# Patient Record
Sex: Female | Born: 1940 | Race: Black or African American | Hispanic: No | Marital: Married | Smoking: Never smoker
Health system: Southern US, Community
[De-identification: ages and names within clinical notes are randomized; demographics above are authoritative.]

## PROBLEM LIST (undated history)

## (undated) DIAGNOSIS — I10 Essential (primary) hypertension: Secondary | ICD-10-CM

## (undated) DIAGNOSIS — E785 Hyperlipidemia, unspecified: Secondary | ICD-10-CM

## (undated) DIAGNOSIS — Z972 Presence of dental prosthetic device (complete) (partial): Secondary | ICD-10-CM

## (undated) DIAGNOSIS — R569 Unspecified convulsions: Secondary | ICD-10-CM

## (undated) DIAGNOSIS — M1990 Unspecified osteoarthritis, unspecified site: Secondary | ICD-10-CM

## (undated) HISTORY — PX: CHOLECYSTECTOMY: SHX55

## (undated) HISTORY — DX: Unspecified osteoarthritis, unspecified site: M19.90

## (undated) HISTORY — DX: Essential (primary) hypertension: I10

## (undated) HISTORY — DX: Hyperlipidemia, unspecified: E78.5

## (undated) HISTORY — DX: Unspecified convulsions: R56.9

---

## 2011-12-11 ENCOUNTER — Inpatient Hospital Stay: Payer: Self-pay | Admitting: Internal Medicine

## 2011-12-11 LAB — CBC
MCH: 30.4 pg (ref 26.0–34.0)
MCHC: 33.7 g/dL (ref 32.0–36.0)
Platelet: 237 10*3/uL (ref 150–440)

## 2011-12-11 LAB — COMPREHENSIVE METABOLIC PANEL
Albumin: 3.9 g/dL (ref 3.4–5.0)
Alkaline Phosphatase: 79 U/L (ref 50–136)
Bilirubin,Total: 0.6 mg/dL (ref 0.2–1.0)
Chloride: 101 mmol/L (ref 98–107)
Co2: 22 mmol/L (ref 21–32)
EGFR (African American): >60
EGFR (Non-African Amer.): >60
Glucose: 150 mg/dL — ABNORMAL HIGH (ref 65–99)
Osmolality: 277 (ref 275–301)
Potassium: 3.5 mmol/L (ref 3.5–5.1)
Sodium: 137 mmol/L (ref 136–145)
Total Protein: 7.1 g/dL (ref 6.4–8.2)

## 2011-12-11 LAB — TROPONIN I: Troponin-I: <0.02 ng/mL

## 2011-12-12 LAB — URINALYSIS, COMPLETE
Bacteria: NONE SEEN
Bilirubin,UR: NEGATIVE
Leukocyte Esterase: NEGATIVE
Protein: NEGATIVE
Specific Gravity: 1.015 (ref 1.003–1.030)
WBC UR: 3 /HPF (ref 0–5)

## 2011-12-12 LAB — CBC WITH DIFFERENTIAL/PLATELET
Basophil %: 0.2 %
Lymphocyte #: 0.7 10*3/uL — ABNORMAL LOW (ref 1.0–3.6)
Lymphocyte %: 10.9 %
MCH: 29.5 pg (ref 26.0–34.0)
MCHC: 32.9 g/dL (ref 32.0–36.0)
Monocyte #: 0.3 x10 3/mm (ref 0.2–0.9)
Monocyte %: 4.9 %
Neutrophil %: 83.9 %
Platelet: 215 10*3/uL (ref 150–440)
RBC: 3.94 10*6/uL (ref 3.80–5.20)
RDW: 13.7 % (ref 11.5–14.5)
WBC: 6.6 10*3/uL (ref 3.6–11.0)

## 2011-12-12 LAB — COMPREHENSIVE METABOLIC PANEL
BUN: 11 mg/dL (ref 7–18)
Bilirubin,Total: 0.6 mg/dL (ref 0.2–1.0)
Calcium, Total: 8.5 mg/dL (ref 8.5–10.1)
EGFR (Non-African Amer.): >60
Glucose: 115 mg/dL — ABNORMAL HIGH (ref 65–99)
Potassium: 3.9 mmol/L (ref 3.5–5.1)
SGOT(AST): 19 U/L (ref 15–37)
SGPT (ALT): 11 U/L — ABNORMAL LOW
Sodium: 139 mmol/L (ref 136–145)

## 2011-12-12 LAB — CK TOTAL AND CKMB (NOT AT ARMC)
CK, Total: 88 U/L (ref 21–215)
CK, Total: 78 U/L (ref 21–215)

## 2011-12-12 LAB — LIPID PANEL
Cholesterol: 151 mg/dL (ref 0–200)
HDL Cholesterol: 31 mg/dL — ABNORMAL LOW (ref 40–60)
Ldl Cholesterol, Calc: 112 mg/dL — ABNORMAL HIGH (ref 0–100)

## 2011-12-12 LAB — TROPONIN I
Troponin-I: <0.02 ng/mL
Troponin-I: <0.02 ng/mL

## 2011-12-12 LAB — HEMOGLOBIN A1C: Hemoglobin A1C: 5.7 % (ref 4.2–6.3)

## 2013-05-16 DIAGNOSIS — G40109 Localization-related (focal) (partial) symptomatic epilepsy and epileptic syndromes with simple partial seizures, not intractable, without status epilepticus: Secondary | ICD-10-CM | POA: Insufficient documentation

## 2014-01-26 ENCOUNTER — Inpatient Hospital Stay: Payer: Self-pay | Admitting: Internal Medicine

## 2014-01-26 LAB — COMPREHENSIVE METABOLIC PANEL
ALBUMIN: 3.4 g/dL (ref 3.4–5.0)
ALK PHOS: 115 U/L
AST: 19 U/L (ref 15–37)
Anion Gap: 10 (ref 7–16)
BILIRUBIN TOTAL: 0.4 mg/dL (ref 0.2–1.0)
BUN: 17 mg/dL (ref 7–18)
CHLORIDE: 109 mmol/L — AB (ref 98–107)
Calcium, Total: 8.8 mg/dL (ref 8.5–10.1)
Co2: 24 mmol/L (ref 21–32)
Creatinine: 0.83 mg/dL (ref 0.60–1.30)
EGFR (African American): >60
Glucose: 125 mg/dL — ABNORMAL HIGH (ref 65–99)
Osmolality: 288 (ref 275–301)
Potassium: 3.4 mmol/L — ABNORMAL LOW (ref 3.5–5.1)
SGPT (ALT): 15 U/L (ref 12–78)
Sodium: 143 mmol/L (ref 136–145)
TOTAL PROTEIN: 6.8 g/dL (ref 6.4–8.2)

## 2014-01-26 LAB — CBC
HCT: 38.4 % (ref 35.0–47.0)
HGB: 12.6 g/dL (ref 12.0–16.0)
MCH: 29.5 pg (ref 26.0–34.0)
MCHC: 32.8 g/dL (ref 32.0–36.0)
MCV: 90 fL (ref 80–100)
PLATELETS: 213 10*3/uL (ref 150–440)
RBC: 4.27 10*6/uL (ref 3.80–5.20)
RDW: 13.8 % (ref 11.5–14.5)
WBC: 8.1 10*3/uL (ref 3.6–11.0)

## 2014-01-28 LAB — BASIC METABOLIC PANEL
Anion Gap: 3 — ABNORMAL LOW (ref 7–16)
BUN: 14 mg/dL (ref 7–18)
CALCIUM: 8.8 mg/dL (ref 8.5–10.1)
CO2: 28 mmol/L (ref 21–32)
Chloride: 109 mmol/L — ABNORMAL HIGH (ref 98–107)
Creatinine: 0.91 mg/dL (ref 0.60–1.30)
EGFR (African American): >60
EGFR (Non-African Amer.): >60
Glucose: 109 mg/dL — ABNORMAL HIGH (ref 65–99)
Osmolality: 280 (ref 275–301)
Potassium: 3.7 mmol/L (ref 3.5–5.1)
SODIUM: 140 mmol/L (ref 136–145)

## 2014-05-10 ENCOUNTER — Ambulatory Visit: Payer: Self-pay

## 2014-12-21 NOTE — H&P (Signed)
PATIENT NAME:  Renee Lopez, Renee Lopez MR#:  [id number] DATE OF BIRTH:  17-Aug-1941  DATE OF ADMISSION:  01/26/2014  PRIMARY CARE PHYSICIAN: Marland McalpineSheikh A. Ellsworth Lennoxejan-Sie, MD  CHIEF COMPLAINT: Fall today.  HISTORY OF PRESENT ILLNESS: A 74 year old [place] female with a history of hypertension, hyperlipidemia, and asthma was sent to ED due to fall today. The patient is alert, awake, oriented, in no acute distress. 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 The patient denies any loss of consciousness, seizure or syncope. No weakness. No headache or dizziness. The patient complains of right side hip pain, unable to move lower extremities. The patient got an x-ray which showed a nondisplaced right pelvic fracture.   PAST MEDICAL HISTORY: Hypertension, hyperlipidemia, asthma, seizure disorder.   PAST SURGICAL HISTORY: No.   SOCIAL HISTORY: No smoking or drinking or illicit drugs.   FAMILY HISTORY: No hypertension, diabetes, heart attack or stroke. No other family history.   ALLERGIES: No.   HOME MEDICATIONS: 1. Protonix 40 mg p.Lopez. daily.  2. Lipitor 10 mg p.Lopez. at bedtime.  3. Cozaar 50 mg p.Lopez. daily.  4. Aspirin 81 mg p.Lopez. daily.  5. Keppra 500 mg p.Lopez. b.i.d.   REVIEW OF SYSTEMS:   CONSTITUTIONAL: The patient denies any fever or chills. No headache or dizziness. No weakness.  CARDIOVASCULAR: No chest pain, palpitation, orthopnea, nocturnal dyspnea. No leg edema.  EYES: No double vision or blurry vision.  ENT: No postnasal drip, slurred speech or dysphagia.  PULMONARY: No cough, sputum, shortness of breath or hemoptysis.  GASTROINTESTINAL: No abdominal pain, nausea, vomiting, diarrhea. No melena or bloody stool.  GENITOURINARY: No dysuria, hematuria, or incontinence.  SKIN: No rash or jaundice.  NEUROLOGIC: No syncope, loss of consciousness or seizure.  MUSCULOSKELETAL: Right hip pain.   PHYSICAL EXAMINATION: VITAL SIGNS: Temperature 97.8, blood pressure 163/79, pulse 55,  oxygen saturation 100% on room air.  GENERAL: The patient is alert, awake, oriented, in no acute distress.  HEENT: Pupils round, equal and reactive to light and accommodation.  Moist oral mucosa. Clear oropharynx. NECK: Supple. No JVD or carotid bruit. No lymphadenopathy or thyromegaly.  CARDIOVASCULAR: S1, S2 regular rate and rhythm. No murmurs or gallops.  PULMONARY: Bilateral air entry. No wheezing or rales. No use of accessory muscles to breathe.  ABDOMEN: Soft. No distention or tenderness. No organomegaly. Bowel sounds present.  EXTREMITIES: No edema, clubbing or cyanosis. No calf tenderness. Bilateral pedal pulses present.  NEUROLOGIC: AO x3. No focal deficits. The patient cannot move her lower extremities due to pain.  SKIN: No rash or jaundice.   LABORATORY DATA:  1. Chest x-ray: No active disease.  2. Right hip x-ray showed no acute osseous injury of right hip.  3. A CAT scan of her hip showed nondisplaced comminuted right inferior pubic ramus fracture. No right hip fracture or dislocation.  4. Communicated and mildly displaced right iliac wing fracture.   Glucose 125, BUN 17, creatinine 0.83, sodium 143, potassium 3.4, chloride 109, bicarbonate 24. CBC in normal range.   IMPRESSIONS: 1. Pelvic fracture.  2. Hypertension.  3. Hyperlipidemia.  4. History of seizure disorder.  5. Hypokalemia.   PLAN OF TREATMENT: 1. The patient will be admitted to medical floor. We will start pain control, get a PT consult. According to the ED physician, Dr. Ernest PineHooten, suggested no indication for surgery. We will get her PT evaluation, possible discharge plan to rehab or home with home health.  2. For hypertension, hyperlipidemia, will continue aspirin, Cozaar,  Lipitor.  3. Deep vein thrombosis prophylaxis.  4. For hypokalemia, give potassium supplement.  5. I discussed the patient's condition and plan of treatment with the patient and the patient's sons.  CODE STATUS: The patient wants FULL  CODE.   TIME SPENT: About 32 minutes.    ____________________________ Shaune Pollack, MD qc:lm D: 01/26/2014 20:51:52 ET T: 01/26/2014 21:38:06 ET JOB#: [id number]  cc: Shaune Pollack, MD, <Dictator> Shaune Pollack MD ELECTRONICALLY SIGNED 01/30/2014 12:47

## 2014-12-22 NOTE — H&P (Signed)
PATIENT NAME:  Renee Lopez, Renee Lopez MR#:  [id number] DATE OF BIRTH:  [birth year]  DATE OF ADMISSION:  12/11/2011  REFERRING PHYSICIAN: Dr. Carollee MassedKaminski FAMILY PHYSICIAN: None  REASON FOR ADMISSION: Transient left right-sided weakness worrisome for transient ischemic attack.   HISTORY OF PRESENT ILLNESS: The patient is a 74 year old female with no local physician with history of asthma who presents to the Emergency Room with transient left-sided weakness which has subsequently resolved. The patient states that she has had shortness of breath, weakness, nausea, and vomiting. Developed acute onset left-sided weakness which lasted approximately 30 to 45 minutes. Symptoms resolved and her exam is currently unremarkable. She is now admitted for further evaluation.   PAST MEDICAL HISTORY:  1. History of asthma.  2. Status post cholecystectomy.  3. Gastroesophageal reflux disease.   MEDICATIONS: None.   ALLERGIES: No known drug allergies.   SOCIAL HISTORY: Negative for alcohol or tobacco abuse.   FAMILY HISTORY: Positive for hypertension and diabetes.   REVIEW OF SYSTEMS: CONSTITUTIONAL: No fever or change in weight. EYES: No blurred or double vision. No glaucoma. ENT: No tinnitus or hearing loss. No nasal discharge or bleeding. No difficulty swallowing. RESPIRATORY: No cough or wheezing. No hemoptysis. No painful respiration. CARDIOVASCULAR: No chest pain or orthopnea. Denies palpitations or syncope. GASTROINTESTINAL: Some nausea and vomiting but no diarrhea. No abdominal pain. No change in bowel habits. GENITOURINARY: No dysuria or hematuria. No incontinence. ENDOCRINE: No polyuria or polydipsia. No heat or cold intolerance. HEMATOLOGIC: Patient denies anemia, easy bruising, or bleeding. LYMPHATIC: No swollen glands. MUSCULOSKELETAL: Patient denies pain in her neck, back, shoulders, knees, or hips. No gout. NEUROLOGIC: No numbness although she has had generalized weakness. Denies migraines, stroke or  seizures. PSYCH: Patient denies anxiety, insomnia, or depression.   PHYSICAL EXAMINATION:  GENERAL: The patient is in no acute distress.   VITAL SIGNS: Vital signs remarkable for a blood pressure of 163/81 with a heart rate of 59 and a respiratory rate of 18. She is afebrile.   HEENT: Normocephalic, atraumatic. Pupils equally round and reactive to light and accommodation. Extraocular movements are intact. Sclerae are not icteric. Conjunctivae are clear. Oropharynx is clear.   NECK: Supple without jugular venous distention or bruits. No adenopathy or thyromegaly is noted.   LUNGS: Clear to auscultation and percussion without wheezes, rales, or rhonchi. No dullness.   CARDIAC: Regular rate and rhythm with normal S1, S2. No significant rubs, murmurs, or gallops. PMI is nondisplaced. Chest wall is nontender.   ABDOMEN: Soft, nontender with normoactive bowel sounds. No organomegaly or masses were appreciated. No hernias or bruits were noted.   EXTREMITIES: Without clubbing, cyanosis, edema. Pulses were 2+ bilaterally.   SKIN: Warm and dry without rash or lesions.   NEUROLOGIC: Cranial nerves II through XII grossly intact. Deep tendon reflexes were symmetric. Motor and sensory exam is nonfocal.   PSYCH: Exam revealed a patient who is alert and oriented to person, place, and time. She was cooperative and used good judgment.   LABORATORY, DIAGNOSTIC AND RADIOLOGICAL DATA: White count was 8.6 with a hemoglobin of 12.9. Glucose was 150 with a BUN of 13 and a creatinine of 0.87 with a sodium of 137 and a potassium of 3.5. GFR was greater than 60. Troponin was less than 0.02. EKG revealed sinus bradycardia with no acute ischemic changes. Head CT revealed no acute abnormality.   ASSESSMENT:  1. Transient left-sided weakness worrisome for transient ischemic attack.  2. Shortness of breath.  3. Nausea, vomiting.  4.  Bradycardia.  5. Benign hypertension.  6. Hyperglycemia.   PLAN: Patient will be  admitted to telemetry with Lovenox and Plavix. Will begin empiric statin therapy as well as Cozaar for blood pressure control. Will follow her sugars with Accu-Cheks before meals and at bedtime and add sliding scale insulin as needed. Will check an A1c and a lipid profile in the morning. Will perform neuro checks q.4 hours at this time with vital signs q.4 hours. Will obtain carotid Doppler's, echocardiogram, and an MRI of the brain because of her transient ischemic attack-like symptoms. Further treatment and evaluation will depend upon the patient's progress.   TOTAL TIME SPENT ON THIS PATIENT: 50 minutes.   ____________________________ Duane Lope Judithann Sheen, MD jds:cms D: 12/11/2011 22:44:50 ET T: 12/12/2011 08:23:35 ET JOB#: [id number]  cc: Duane Lope. Judithann Sheen, MD, <Dictator> Altie Savard Rodena Medin MD ELECTRONICALLY SIGNED 12/14/2011 15:05

## 2014-12-22 NOTE — Discharge Summary (Signed)
PATIENT NAME:  Renee Lopez, Renee Lopez MR#:  [id number] DATE OF BIRTH:  [birth year]  DATE OF ADMISSION:  12/11/2011 DATE OF DISCHARGE:  12/13/2011  REASON FOR ADMISSION: Transient left side weakness.  DISCHARGE DIAGNOSES: 1. Possible transient ischemic attack.   2. Hypertension.  3. Hyperlipidemia.  HOME MEDICATIONS: 1. Aspirin 81 mg p.Lopez. daily.  2. Cozaar 50 mg p.Lopez. daily.  3. Protonix 40 mg p.Lopez. daily.  4. Lipitor 10 mg p.Lopez. daily.  DIET: Low sodium diet.  ACTIVITY: As tolerated.  DISCHARGE FOLLOWUP: Followup with your  PCP within 1 to 2 weeks.   HOSPITAL COURSE: The patient is a 74 year old African American female with past history of asthma and GERD who presented to the emergency department with transient left side weakness which subsequently resolved. The patient also stated that she had shortness of breath, weakness, nausea and vomiting. She said the left side weakness lasted about 30 to 45 minutes. The patient was admitted for transient left side weakness worrisome for TIA. For a detailed history and physical examination, please refer to the admission note dictated by Dr. Judithann SheenSparks. After admission the patient has been treated with Plavix and Lipitor for possible transient ischemic attack. In addition, since the patient has an elevated blood pressure, she has been treated with Cozaar. Her of the CT head revealed no acute abnormality. MRI of the brain showed no intracranial abnormalities. Carotid duplex showed no stenosis. Echocardiogram showed normal ejection fraction of more than 55%. The patient had elevated blood sugar of about 115 to 130, but Hem A1c was 5.7. In addition troponin level was less than 0.02. The patient's lipid panel showed a LDL of 112 and HDL 31. She has been treated with Lipitor. Since the patient is clinically stable, she will be discharged to home today. I discussed the patient's situation and the discharge plan with the patient, the patient's two daughters, and the  nurse.   TIME SPENT: Approximately 45 minutes. ___________________________ Renee PollackQing Yi Falletta, MD qc:slb D: 12/13/2011 17:41:54 ET T: 12/14/2011 16:07:47 ET JOB#: [id number]  cc: Renee PollackQing Jaeley Wiker, MD, <Dictator> Renee PollackQING Natallie Ravenscroft MD ELECTRONICALLY SIGNED 12/14/2011 17:33

## 2015-05-06 ENCOUNTER — Ambulatory Visit (INDEPENDENT_AMBULATORY_CARE_PROVIDER_SITE_OTHER): Payer: Medicare PPO | Admitting: Unknown Physician Specialty

## 2015-05-06 ENCOUNTER — Encounter: Payer: Self-pay | Admitting: Unknown Physician Specialty

## 2015-05-06 VITALS — BP 160/89 | HR 61 | Temp 98.2°F | Ht 62.8 in | Wt 139.8 lb

## 2015-05-06 DIAGNOSIS — Z0000 Encounter for general adult medical examination without abnormal findings: Secondary | ICD-10-CM

## 2015-05-06 DIAGNOSIS — I10 Essential (primary) hypertension: Secondary | ICD-10-CM

## 2015-05-06 DIAGNOSIS — E785 Hyperlipidemia, unspecified: Secondary | ICD-10-CM

## 2015-05-06 DIAGNOSIS — M1990 Unspecified osteoarthritis, unspecified site: Secondary | ICD-10-CM | POA: Insufficient documentation

## 2015-05-06 DIAGNOSIS — M1712 Unilateral primary osteoarthritis, left knee: Secondary | ICD-10-CM

## 2015-05-06 LAB — LIPID PANEL PICCOLO, WAIVED
Chol/HDL Ratio Piccolo,Waive: 4.1 mg/dL
Cholesterol Piccolo, Waived: 123 mg/dL (ref <200)
HDL Chol Piccolo, Waived: 30 mg/dL — ABNORMAL LOW (ref >59)
LDL Chol Calc Piccolo Waived: 76 mg/dL (ref <100)
TRIGLYCERIDES PICCOLO,WAIVED: 85 mg/dL (ref <150)
VLDL CHOL CALC PICCOLO,WAIVE: 17 mg/dL (ref <30)

## 2015-05-06 MED ORDER — LOSARTAN POTASSIUM 100 MG PO TABS: 100.0000 mg | ORAL_TABLET | Freq: Every day | ORAL | Status: DC

## 2015-05-06 MED ORDER — ATORVASTATIN CALCIUM 10 MG PO TABS: 10.0000 mg | ORAL_TABLET | Freq: Every day | ORAL | Status: DC

## 2015-05-06 NOTE — Progress Notes (Signed)
Renee Lopez is a 74 y.o. female who presents for Medicare Annual/Subsequent preventive examination.  Preventive Screening-Counseling & Management  Tobacco History  Smoking status  . Never Smoker   Smokeless tobacco  . Never Used     Problems Prior to Visit 1. Hypertension  High today but white coat syndrome.  Below 120/70 at home.   2.  Hypercholesterol Doing well on Atorvastatin  Current Problems (verified) Patient Active Problem List   Diagnosis Date Noted  . Hypertension 05/06/2015  . Hyperlipidemia 05/06/2015  . Osteoarthritis 05/06/2015    Medications Prior to Visit No current outpatient prescriptions on file prior to visit.   No current facility-administered medications on file prior to visit.    Current Medications (verified) Current Outpatient Prescriptions  Medication Sig Dispense Refill  . aspirin 81 MG tablet Take 81 mg by mouth daily.    Marland Kitchen atorvastatin (LIPITOR) 10 MG tablet Take 10 mg by mouth daily.    Marland Kitchen levETIRAcetam (KEPPRA) 500 MG tablet Take 500 mg by mouth 2 (two) times daily.    Marland Kitchen losartan (COZAAR) 100 MG tablet Take 100 mg by mouth daily.     No current facility-administered medications for this visit.     Allergies (verified) Review of patient's allergies indicates no known allergies.   PAST HISTORY  Family History Family History  Problem Relation Age of Onset  . Seizures Mother   . Hyperlipidemia Mother   . Hypertension Mother   . Cancer Father     colon  . Hypertension Father     Social History Social History  Substance Use Topics  . Smoking status: Never Smoker   . Smokeless tobacco: Never Used  . Alcohol Use: No      Are there smokers in your home (other than you)? No  Risk Factors Current exercise habits: Home exercise routine includes walking 2-3 hrs per day.  Dietary issues discussed: healthy   Cardiac risk factors: advanced age (older than 64 for men, 48 for women).  Depression Screen (Note: if  answer to either of the following is "Yes", a more complete depression screening is indicated)   Over the past two weeks, have you felt down, depressed or hopeless? No  Over the past two weeks, have you felt little interest or pleasure in doing things? No  Have you lost interest or pleasure in daily life? No  Do you often feel hopeless? No  Do you cry easily over simple problems? No  Activities of Daily Living In your present state of health, do you have any difficulty performing the following activities?:  Driving? Yes Managing money?  No Feeding yourself? No Getting from bed to chair? NoBP 160/89 mmHg  Pulse 61  Temp(Src) 98.2 F (36.8 C)  Ht 5' 2.8" (1.595 m)  Wt 139 lb 12.8 oz (63.413 kg)  BMI 24.93 kg/m2  SpO2 96%  General Appearance:    Alert, cooperative, no distress, appears stated age  Head:    Normocephalic, without obvious abnormality, atraumatic  Eyes:    PERRL, conjunctiva/corneas clear, EOM's intact, fundi    benign, both eyes  Ears:    Normal TM's and external ear canals, both ears  Nose:   Nares normal, septum midline, mucosa normal, no drainage    or sinus tenderness  Throat:   Lips, mucosa, and tongue normal; teeth and gums normal  Neck:   Supple, symmetrical, trachea midline, no adenopathy;    thyroid:  no enlargement/tenderness/nodules; no carotid   bruit or JVD  Back:     Symmetric, no curvature, ROM normal, no CVA tenderness  Lungs:     Clear to auscultation bilaterally, respirations unlabored  Chest Wall:    No tenderness or deformity   Heart:    Regular rate and rhythm, S1 and S2 normal, no murmur, rub   or gallop     Abdomen:     Soft, non-tender, bowel sounds active all four quadrants,    no masses, no organomegaly  Genitalia:    Normal female without lesion, discharge or tenderness  Rectal:    Normal tone, normal prostate, no masses or tenderness;   guaiac negative stool  Extremities:   Extremities normal, atraumatic, no cyanosis or edema  Pulses:    2+ and symmetric all extremities  Skin:   Skin color, texture, turgor normal, no rashes or lesions  Lymph nodes:   Cervical, supraclavicular, and axillary nodes normal  Neurologic:   CNII-XII intact, normal strength, sensation and reflexes    throughout   Climbing a flight of stairs? No Preparing food and eating?: No Bathing or showering? No Getting dressed: No Getting to the toilet? No Using the toilet:No Moving around from place to place: No In the past year have you fallen or had a near fall?:No   Are you sexually active?  No  Do you have more than one partner?  No  Hearing Difficulties: No Do you often ask people to speak up or repeat themselves? No Do you experience ringing or noises in your ears? No Do you have difficulty understanding soft or whispered voices? No   Do you feel that you have a problem with memory? no  Do you often misplace items? No  Do you feel safe at home?  No  Cognitive Testing  Alert? yes Normal Appearance?yes  Oriented to person? Yes  Place? Yes   Time? Yes  Recall of three objects?  Yes  Can perform simple calculations? Yes  Displays appropriate judgment?Yes  Can read the correct time from a watch face?Yes   Advanced Directives have been discussed with the patient? Yes   Immunization History  Administered Date(s) Administered  . Pneumococcal Conjugate-13 05/10/2014  . Td 01/07/2014    Screening Tests Health Maintenance  Topic Date Due  . COLONOSCOPY  04/06/1989  . ZOSTAVAX  04/07/1999  . DEXA SCAN  04/06/2004  . INFLUENZA VACCINE  03/31/2015  . PNA vac Low Risk Adult (2 of 2 - PPSV23) 05/11/2015  . TETANUS/TDAP  01/08/2024   Refusing all outstanding health maintenance.    Hypertension: Stable.  Continue present meds.   Hyperlipidemia:  Stable.  Continue present meds.    Check CMP, Uric Acid, Lipid Panel, Microalbumin  Subjective:    Renee Lopez is a 74 y.o. female who presents for Medicare Annual/Subsequent  preventive examination.  Preventive Screening-Counseling & Management  Tobacco History  Smoking status  . Never Smoker   Smokeless tobacco  . Never Used     Problems Prior to Visit 1.   Current Problems (verified) Patient Active Problem List   Diagnosis Date Noted  . Hypertension 05/06/2015  . Hyperlipidemia 05/06/2015  . Osteoarthritis 05/06/2015    Medications Prior to Visit No current outpatient prescriptions on file prior to visit.   No current facility-administered medications on file prior to visit.    Current Medications (verified) Current Outpatient Prescriptions  Medication Sig Dispense Refill  . aspirin 81 MG tablet Take 81 mg by mouth daily.    Marland Kitchen atorvastatin (LIPITOR) 10  MG tablet Take 10 mg by mouth daily.    Marland Kitchen levETIRAcetam (KEPPRA) 500 MG tablet Take 500 mg by mouth 2 (two) times daily.    Marland Kitchen losartan (COZAAR) 100 MG tablet Take 100 mg by mouth daily.     No current facility-administered medications for this visit.     Allergies (verified) Review of patient's allergies indicates no known allergies.   PAST HISTORY  Family History Family History  Problem Relation Age of Onset  . Seizures Mother   . Hyperlipidemia Mother   . Hypertension Mother   . Cancer Father     colon  . Hypertension Father     Social History Social History  Substance Use Topics  . Smoking status: Never Smoker   . Smokeless tobacco: Never Used  . Alcohol Use: No      Are there smokers in your home (other than you)? No  Risk Factors Current exercise habits: Home exercise routine includes walking 2-3 hrs per day.  Dietary issues discussed: healthy   Cardiac risk factors: advanced age (older than 25 for men, 18 for women).  Depression Screen (Note: if answer to either of the following is "Yes", a more complete depression screening is indicated)   Over the past two weeks, have you felt down, depressed or hopeless? No  Over the past two weeks, have you felt little  interest or pleasure in doing things? No  Have you lost interest or pleasure in daily life? No  Do you often feel hopeless? No  Do you cry easily over simple problems? No  Activities of Daily Living In your present state of health, do you have any difficulty performing the following activities?:  Driving? Yes Managing money?  No Feeding yourself? No Getting from bed to chair? NoBP 160/89 mmHg  Pulse 61  Temp(Src) 98.2 F (36.8 C)  Ht 5' 2.8" (1.595 m)  Wt 139 lb 12.8 oz (63.413 kg)  BMI 24.93 kg/m2  SpO2 96%  General Appearance:    Alert, cooperative, no distress, appears stated age  Head:    Normocephalic, without obvious abnormality, atraumatic  Eyes:    PERRL, conjunctiva/corneas clear, EOM's intact, fundi    benign, both eyes  Ears:    Normal TM's and external ear canals, both ears  Nose:   Nares normal, septum midline, mucosa normal, no drainage    or sinus tenderness  Throat:   Lips, mucosa, and tongue normal; teeth and gums normal  Neck:   Supple, symmetrical, trachea midline, no adenopathy;    thyroid:  no enlargement/tenderness/nodules; no carotid   bruit or JVD  Back:     Symmetric, no curvature, ROM normal, no CVA tenderness  Lungs:     Clear to auscultation bilaterally, respirations unlabored  Chest Wall:    No tenderness or deformity   Heart:    Regular rate and rhythm, S1 and S2 normal, no murmur, rub   or gallop     Abdomen:     Soft, non-tender, bowel sounds active all four quadrants,    no masses, no organomegaly  Genitalia:    Normal female without lesion, discharge or tenderness  Rectal:    Normal tone, normal prostate, no masses or tenderness;   guaiac negative stool  Extremities:   Extremities normal, atraumatic, no cyanosis or edema  Pulses:   2+ and symmetric all extremities  Skin:   Skin color, texture, turgor normal, no rashes or lesions  Lymph nodes:   Cervical, supraclavicular, and axillary nodes normal  Neurologic:   CNII-XII intact, normal  strength, sensation and reflexes    throughout   Climbing a flight of stairs? No Preparing food and eating?: No Bathing or showering? No Getting dressed: No Getting to the toilet? No Using the toilet:No Moving around from place to place: No In the past year have you fallen or had a near fall?:No   Are you sexually active?  No  Do you have more than one partner?  No  Hearing Difficulties: No Do you often ask people to speak up or repeat themselves? No Do you experience ringing or noises in your ears? No Do you have difficulty understanding soft or whispered voices? No   Do you feel that you have a problem with memory? no  Do you often misplace items? No  Do you feel safe at home?  No  Cognitive Testing  Alert? yes Normal Appearance?yes  Oriented to person? Yes  Place? Yes   Time? Yes  Recall of three objects?  Yes  Can perform simple calculations? Yes  Displays appropriate judgment?Yes  Can read the correct time from a watch face?Yes   Advanced Directives have been discussed with the patient? Yes  List the Names of Other Physician/Practitioners you currently use: 1.    Indicate any recent Medical Services you may have received from other than [facility] providers in the past year (date may be approximate).  Immunization History  Administered Date(s) Administered  . Pneumococcal Conjugate-13 05/10/2014  . Td 01/07/2014    Screening Tests Health Maintenance  Topic Date Due  . COLONOSCOPY  04/06/1989  . ZOSTAVAX  04/07/1999  . DEXA SCAN  04/06/2004  . INFLUENZA VACCINE  03/31/2015  . PNA vac Low Risk Adult (2 of 2 - PPSV23) 05/11/2015  . TETANUS/TDAP  01/08/2024    All answers were reviewed with the patient and necessary referrals were made:  Gabriel Cirri, NP   05/06/2015   History reviewed: allergies, current medications, past family history, past medical history, past social history, past surgical history and problem list  Review of Systems A comprehensive  review of systems was negative.    Objective:     Vision by Snellen chart: right eye:20/20, left eye:20/20  Body mass index is 24.93 kg/(m^2). BP 160/89 mmHg  Pulse 61  Temp(Src) 98.2 F (36.8 C)  Ht 5' 2.8" (1.595 m)  Wt 139 lb 12.8 oz (63.413 kg)  BMI 24.93 kg/m2  SpO2 96%  BP 160/89 mmHg  Pulse 61  Temp(Src) 98.2 F (36.8 C)  Ht 5' 2.8" (1.595 m)  Wt 139 lb 12.8 oz (63.413 kg)  BMI 24.93 kg/m2  SpO2 96%  General Appearance:    Alert, cooperative, no distress, appears stated age  Head:    Normocephalic, without obvious abnormality, atraumatic  Eyes:    PERRL, conjunctiva/corneas clear, EOM's intact, fundi    benign, both eyes  Ears:    Normal TM's and external ear canals, both ears  Nose:   Nares normal, septum midline, mucosa normal, no drainage    or sinus tenderness  Throat:   Lips, mucosa, and tongue normal; teeth and gums normal  Neck:   Supple, symmetrical, trachea midline, no adenopathy;    thyroid:  no enlargement/tenderness/nodules; no carotid   bruit or JVD  Back:     Symmetric, no curvature, ROM normal, no CVA tenderness  Lungs:     Clear to auscultation bilaterally, respirations unlabored  Chest Wall:    No tenderness or deformity   Heart:  Regular rate and rhythm, S1 and S2 normal, no murmur, rub   or gallop  Breast Exam:    No tenderness, masses, or nipple abnormality  Abdomen:     Soft, non-tender, bowel sounds active all four quadrants,    no masses, no organomegaly  Genitalia:    Normal female without lesion, discharge or tenderness  Rectal:    Normal tone, normal prostate, no masses or tenderness;   guaiac negative stool  Extremities:   Extremities normal, atraumatic, no cyanosis or edema  Pulses:   2+ and symmetric all extremities  Skin:   Skin color, texture, turgor normal, no rashes or lesions  Lymph nodes:   Cervical, supraclavicular, and axillary nodes normal  Neurologic:   CNII-XII intact, normal strength, sensation and reflexes     throughout       Assessment:   Healthy        Plan:     During the course of the visit the patient was educated and counseled about appropriate screening and preventive services including:  She is refusing colonoscopy, Zostavax,  Diet review for nutrition referral? Not Indicated   Patient Instructions (the written plan) was given to the patient.  Medicare Attestation I have personally reviewed: The patient's medical and social history Their use of alcohol, tobacco or illicit drugs Their current medications and supplements The patient's functional ability including ADLs,fall risks, home safety risks, cognitive, and hearing and visual impairment Diet and physical activities Evidence for depression or mood disorders  The patient's weight, height, BMI, and visual acuity have been recorded in the chart.  I have made referrals, counseling, and provided education to the patient based on review of the above and I have provided the patient with a written personalized care plan for preventive services.     Gabriel Cirri, NP   05/06/2015

## 2015-05-07 LAB — COMPREHENSIVE METABOLIC PANEL
A/G RATIO: 1.8 (ref 1.1–2.5)
ALBUMIN: 4.2 g/dL (ref 3.5–4.8)
ALK PHOS: 111 IU/L (ref 39–117)
ALT: 7 IU/L (ref 0–32)
AST: 19 IU/L (ref 0–40)
BUN/Creatinine Ratio: 17 (ref 11–26)
BUN: 13 mg/dL (ref 8–27)
Bilirubin Total: 0.8 mg/dL (ref 0.0–1.2)
CALCIUM: 9.6 mg/dL (ref 8.7–10.3)
CO2: 22 mmol/L (ref 18–29)
Chloride: 100 mmol/L (ref 97–108)
Creatinine, Ser: 0.76 mg/dL (ref 0.57–1.00)
GFR calc Af Amer: 88 mL/min/{1.73_m2} (ref >59)
GFR, EST NON AFRICAN AMERICAN: 76 mL/min/{1.73_m2} (ref >59)
GLOBULIN, TOTAL: 2.4 g/dL (ref 1.5–4.5)
Glucose: 93 mg/dL (ref 65–99)
POTASSIUM: 4.4 mmol/L (ref 3.5–5.2)
SODIUM: 140 mmol/L (ref 134–144)
Total Protein: 6.6 g/dL (ref 6.0–8.5)

## 2015-05-07 LAB — URIC ACID: Uric Acid: 4.8 mg/dL (ref 2.5–7.1)

## 2015-11-03 ENCOUNTER — Encounter: Payer: Self-pay | Admitting: Unknown Physician Specialty

## 2015-11-03 ENCOUNTER — Ambulatory Visit (INDEPENDENT_AMBULATORY_CARE_PROVIDER_SITE_OTHER): Payer: Medicare PPO | Admitting: Unknown Physician Specialty

## 2015-11-03 VITALS — BP 159/93 | HR 58 | Temp 97.9°F | Ht 61.5 in | Wt 140.0 lb

## 2015-11-03 DIAGNOSIS — E785 Hyperlipidemia, unspecified: Secondary | ICD-10-CM | POA: Diagnosis not present

## 2015-11-03 DIAGNOSIS — M1712 Unilateral primary osteoarthritis, left knee: Secondary | ICD-10-CM

## 2015-11-03 DIAGNOSIS — I10 Essential (primary) hypertension: Secondary | ICD-10-CM

## 2015-11-03 NOTE — Assessment & Plan Note (Signed)
Stable, continue present medications.   

## 2015-11-03 NOTE — Assessment & Plan Note (Signed)
Last steroid injection did well for over a year.  Will continue unless no relief

## 2015-11-03 NOTE — Progress Notes (Signed)
BP 159/93 mmHg  Pulse 58  Temp(Src) 97.9 F (36.6 C)  Ht 5' 1.5" (1.562 m)  Wt 140 lb (63.504 kg)  BMI 26.03 kg/m2  SpO2 97%  LMP  (LMP Unknown)   Subjective:    Patient ID: Renee CrandallMildred O Lopez, female    DOB: 03/08/1941, 75 y.o.   MRN: [id number]  HPI: Renee CrandallMildred O Lopez is a 75 y.o. female  Chief Complaint  Patient presents with  . Hyperlipidemia  . Hypertension   Hypertension States her BP is high as she is nervous when she comes in Using medications without difficulty Average home BPs with 120s-130s/80s  No problems or lightheadedness No chest pain with exertion or shortness of breath No Edema   Hyperlipidemia Using medications without problems: No Muscle aches  Diet compliance: eats well Exercise: walks a lot when it is not cold  Left knee pain Last knee injection was October 2016.  She states it is bothering her more and would like another injection   Relevant past medical, surgical, family and social history reviewed and updated as indicated. Interim medical history since our last visit reviewed. Allergies and medications reviewed and updated.  Review of Systems  Per HPI unless specifically indicated above     Objective:    BP 159/93 mmHg  Pulse 58  Temp(Src) 97.9 F (36.6 C)  Ht 5' 1.5" (1.562 m)  Wt 140 lb (63.504 kg)  BMI 26.03 kg/m2  SpO2 97%  LMP  (LMP Unknown)  Wt Readings from Last 3 Encounters:  11/03/15 140 lb (63.504 kg)  05/06/15 139 lb 12.8 oz (63.413 kg)  11/08/14 137 lb (62.143 kg)    Physical Exam  Constitutional: She is oriented to person, place, and time. She appears well-developed and well-nourished. No distress.  HENT:  Head: Normocephalic and atraumatic.  Eyes: Conjunctivae and lids are normal. Right eye exhibits no discharge. Left eye exhibits no discharge. No scleral icterus.  Neck: Normal range of motion. Neck supple. No JVD present. Carotid bruit is not present.  Cardiovascular: Normal rate, regular rhythm and  normal heart sounds.   Pulmonary/Chest: Effort normal and breath sounds normal.  Abdominal: Normal appearance. There is no splenomegaly or hepatomegaly.  Musculoskeletal:       Right knee: She exhibits decreased range of motion and swelling.  Neurological: She is alert and oriented to person, place, and time.  Skin: Skin is warm, dry and intact. No rash noted. No pallor.  Psychiatric: She has a normal mood and affect. Her behavior is normal. Judgment and thought content normal.   STEROID INJECTION  Procedure: Knee Intraarticular Steroid Injection   Description: After verbal consent and patient ed on Area prepped and draped using  semi-sterile technique. Using a anterior  approach, a mixture of 4 cc of  1% Marcaine & 1 cc of Kenalog 40 was injected into knee joint.  A bandage was then placed over the injection site. Complications:  none Post Procedure Instructions: To the ER if any symptoms of erythema or swelling.   Follow Up: prn   Results for orders placed or performed in visit on 05/06/15  Lipid Panel Piccolo, Arrow ElectronicsWaived  Result Value Ref Range   Cholesterol Piccolo, Waived 123 <200 mg/dL   HDL Chol Piccolo, Waived 30 (L) >59 mg/dL   Triglycerides Piccolo,Waived 85 <150 mg/dL   Chol/HDL Ratio Piccolo,Waive 4.1 mg/dL   LDL Chol Calc Piccolo Waived 76 <100 mg/dL   VLDL Chol Calc Piccolo,Waive 17 <30 mg/dL  Uric acid  Result  Value Ref Range   Uric Acid 4.8 2.5 - 7.1 mg/dL  Comprehensive metabolic panel  Result Value Ref Range   Glucose 93 65 - 99 mg/dL   BUN 13 8 - 27 mg/dL   Creatinine, Ser 0.86 0.57 - 1.00 mg/dL   GFR calc non Af Amer 76 >59 mL/min/1.73   GFR calc Af Amer 88 >59 mL/min/1.73   BUN/Creatinine Ratio 17 11 - 26   Sodium 140 134 - 144 mmol/L   Potassium 4.4 3.5 - 5.2 mmol/L   Chloride 100 97 - 108 mmol/L   CO2 22 18 - 29 mmol/L   Calcium 9.6 8.7 - 10.3 mg/dL   Total Protein 6.6 6.0 - 8.5 g/dL   Albumin 4.2 3.5 - 4.8 g/dL   Globulin, Total 2.4 1.5 - 4.5 g/dL    Albumin/Globulin Ratio 1.8 1.1 - 2.5   Bilirubin Total 0.8 0.0 - 1.2 mg/dL   Alkaline Phosphatase 111 39 - 117 IU/L   AST 19 0 - 40 IU/L   ALT 7 0 - 32 IU/L      Assessment & Plan:   Problem List Items Addressed This Visit      Unprioritized   Hypertension - Primary    Stable, continue present medications.        Hyperlipidemia    Stable, continue present medications.       Osteoarthritis    Last steroid injection did well for over a year.  Will continue unless no relief          Follow up plan: Return for 6 months for physical.

## 2016-02-11 ENCOUNTER — Other Ambulatory Visit: Payer: Self-pay | Admitting: Unknown Physician Specialty

## 2016-03-03 ENCOUNTER — Other Ambulatory Visit: Payer: Self-pay | Admitting: Unknown Physician Specialty

## 2016-03-26 IMAGING — CR DG KNEE COMPLETE 4+V*L*
4 series · 4 of 4 positions shown · non-contrast
Comparison: None.

CLINICAL DATA: Pain.

EXAM:
LEFT KNEE - COMPLETE 4+ VIEW

[knee ap]
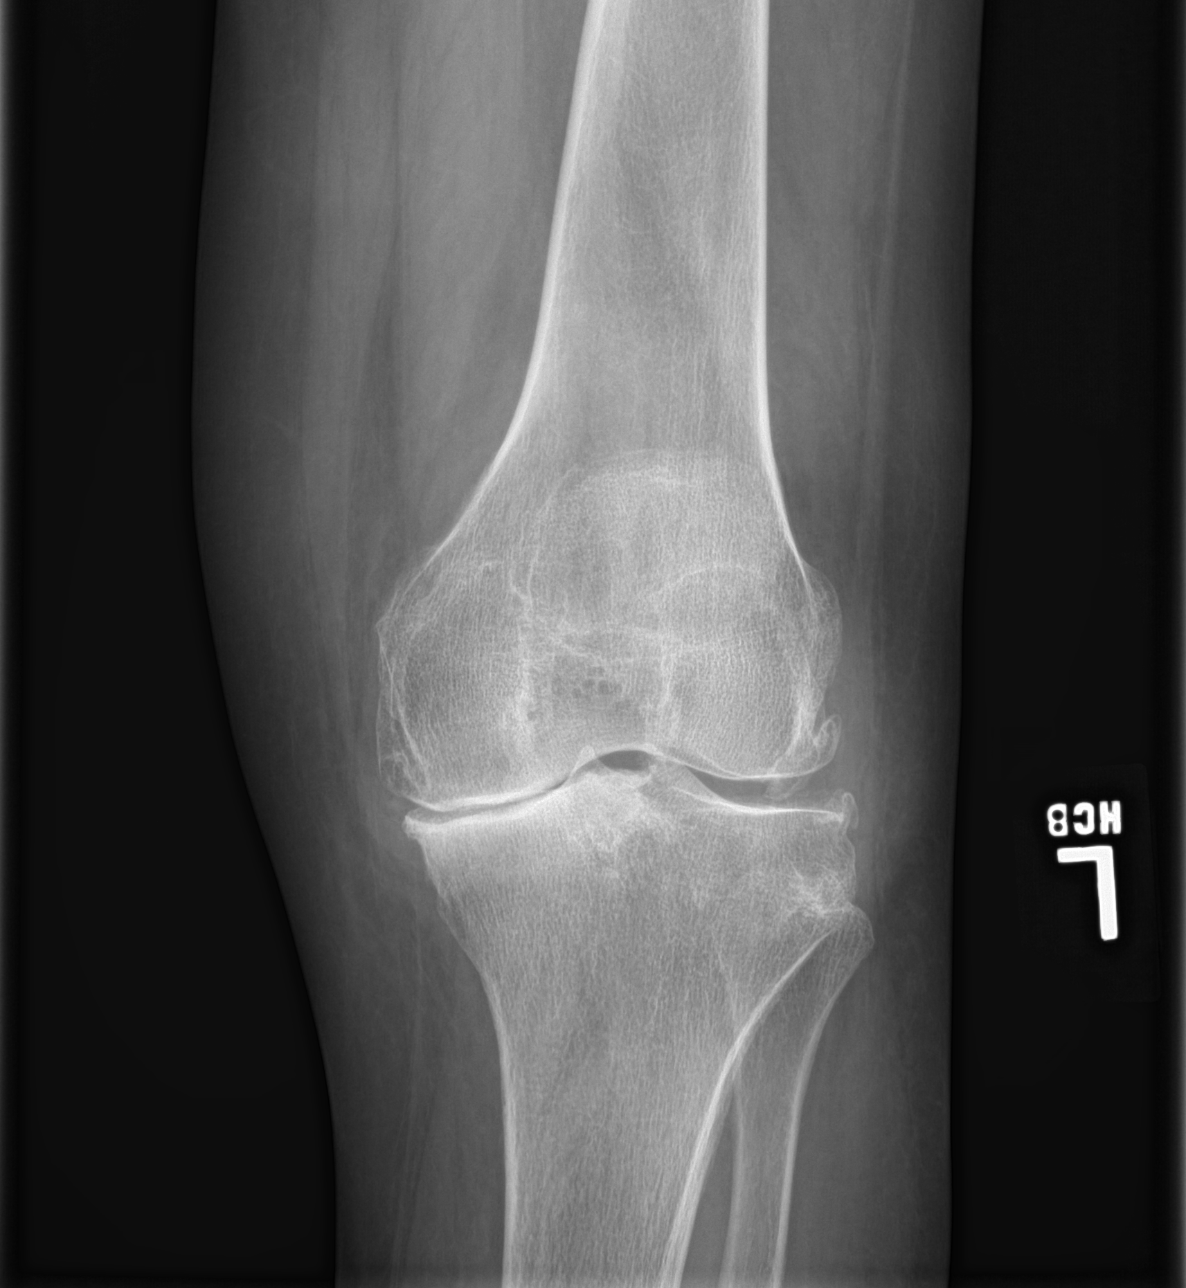

[knee obl (1 of 2)]
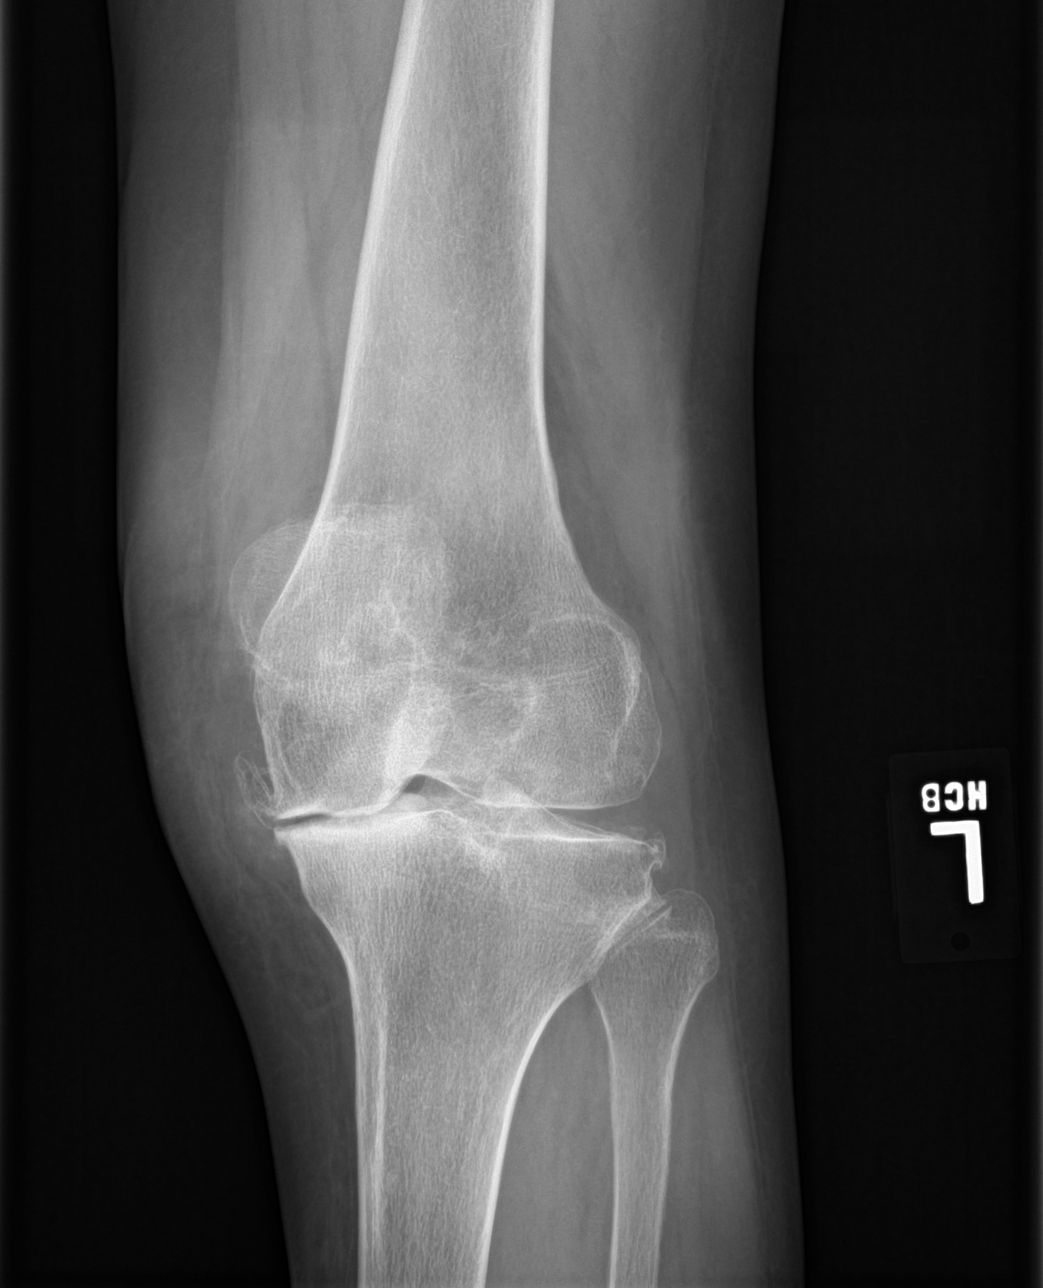

[knee obl (2 of 2)]
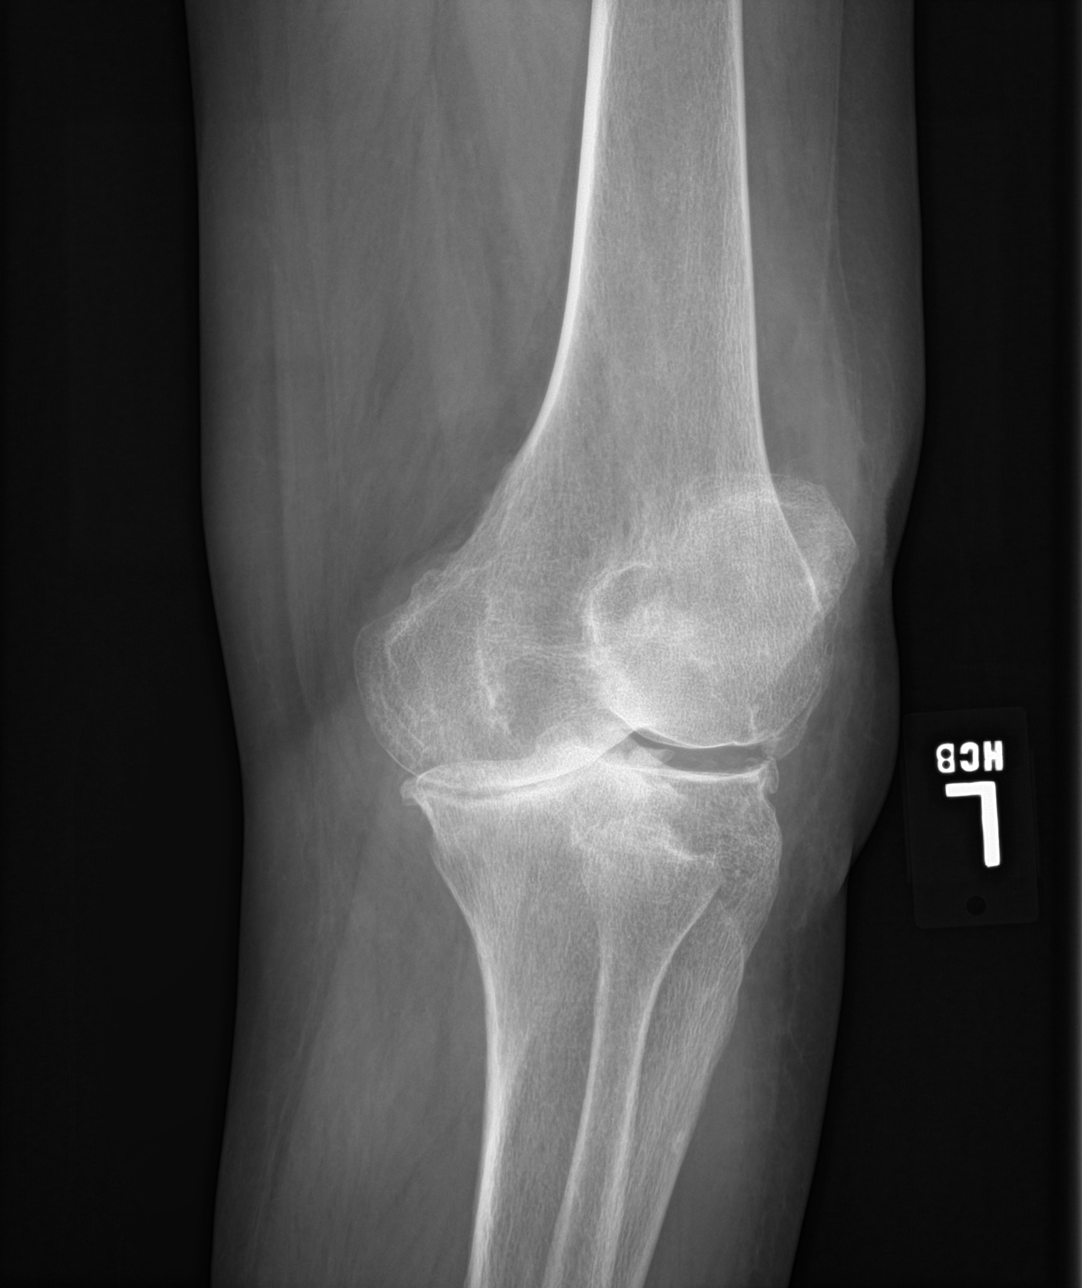

[knee lat]
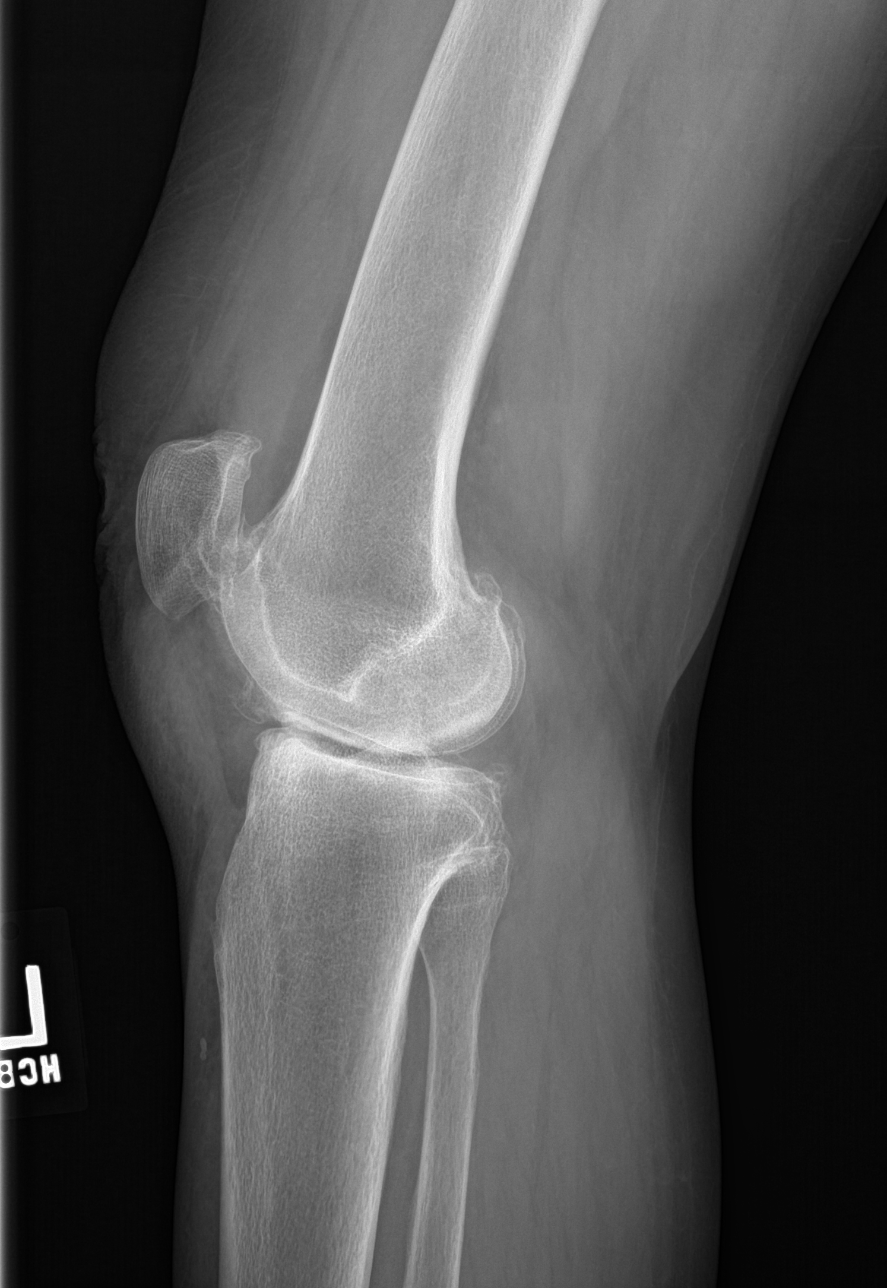

[4 of 4 positions shown; findings below may reference images not displayed]

FINDINGS: Small knee joint fusion. Severe tricompartment degenerative change.
Loose body noted in the medial compartment. No evidence of fracture
or dislocation.
IMPRESSION: 1. Small joint effusion.
2. Severe tricompartment degenerative change with loose body noted
in the medial compartment.

## 2016-04-22 ENCOUNTER — Encounter (INDEPENDENT_AMBULATORY_CARE_PROVIDER_SITE_OTHER): Payer: Self-pay

## 2016-05-05 ENCOUNTER — Other Ambulatory Visit: Payer: Self-pay | Admitting: Unknown Physician Specialty

## 2016-05-19 ENCOUNTER — Encounter: Payer: Self-pay | Admitting: Unknown Physician Specialty

## 2016-05-19 ENCOUNTER — Ambulatory Visit (INDEPENDENT_AMBULATORY_CARE_PROVIDER_SITE_OTHER): Payer: Medicare PPO | Admitting: Unknown Physician Specialty

## 2016-05-19 VITALS — BP 162/84 | HR 51 | Temp 97.9°F | Ht 62.1 in | Wt 142.5 lb

## 2016-05-19 DIAGNOSIS — E785 Hyperlipidemia, unspecified: Secondary | ICD-10-CM

## 2016-05-19 DIAGNOSIS — Z23 Encounter for immunization: Secondary | ICD-10-CM | POA: Diagnosis not present

## 2016-05-19 DIAGNOSIS — Z5181 Encounter for therapeutic drug level monitoring: Secondary | ICD-10-CM | POA: Diagnosis not present

## 2016-05-19 DIAGNOSIS — G40909 Epilepsy, unspecified, not intractable, without status epilepticus: Secondary | ICD-10-CM

## 2016-05-19 DIAGNOSIS — I10 Essential (primary) hypertension: Secondary | ICD-10-CM | POA: Diagnosis not present

## 2016-05-19 MED ORDER — ATORVASTATIN CALCIUM 10 MG PO TABS
10.0000 mg | ORAL_TABLET | Freq: Every day | ORAL | 1 refills | Status: DC
Start: 2016-05-19 — End: 2016-11-23

## 2016-05-19 MED ORDER — LEVETIRACETAM 500 MG PO TABS: 500.0000 mg | ORAL_TABLET | Freq: Two times a day (BID) | ORAL | 3 refills | Status: DC

## 2016-05-19 MED ORDER — LOSARTAN POTASSIUM 100 MG PO TABS: 100.0000 mg | ORAL_TABLET | Freq: Every day | ORAL | 1 refills | Status: DC

## 2016-05-19 NOTE — Progress Notes (Signed)
BP (!) 162/84 (BP Location: Left Arm, Cuff Size: Normal)   Pulse (!) 51   Temp 97.9 F (36.6 C)   Ht 5' 2.1" (1.577 m)   Wt 142 lb 8 oz (64.6 kg)   LMP  (LMP Unknown)   SpO2 95%   BMI 25.98 kg/m    Subjective:    Patient ID: Renee Lopez, female    DOB: Feb 13, 1941, 75 y.o.   MRN: [id number]  HPI: Renee Lopez is a 75 y.o. female  Chief Complaint  Patient presents with  . Medicare Wellness   Functional Status Survey: Is the patient deaf or have difficulty hearing?: No Does the patient have difficulty seeing, even when wearing glasses/contacts?: No Does the patient have difficulty concentrating, remembering, or making decisions?: No Does the patient have difficulty walking or climbing stairs?: No Does the patient have difficulty dressing or bathing?: No Does the patient have difficulty doing errands alone such as visiting a doctor's office or shopping?: No  Fall Risk  05/19/2016  Falls in the past year? No   Depression screen PHQ 2/9 05/19/2016  Decreased Interest 0  Down, Depressed, Hopeless 0  PHQ - 2 Score 0  Altered sleeping 0  Tired, decreased energy 0  Change in appetite 0  Feeling bad or failure about yourself  0  Trouble concentrating 0  Moving slowly or fidgety/restless 0  Suicidal thoughts 0  PHQ-9 Score 0    Hypertension Using medications without difficulty Average home BPs Mainly 120's/70's  No problems or lightheadedness No chest pain with exertion or shortness of breath No Edema  Hyperlipidemia Using medications without problems: No Muscle aches  Does a lot of walking with living [place] farm Does not fry foods and overall to a great appetite but weight is stable.    Seizure disorder No seizure for years.  Maintained on Keppra  Mini Cog is negative.    Relevant past medical, surgical, family and social history reviewed and updated as indicated. Interim medical history since our last visit reviewed. Allergies and medications  reviewed and updated.  Review of Systems  Per HPI unless specifically indicated above     Objective:    BP (!) 162/84 (BP Location: Left Arm, Cuff Size: Normal)   Pulse (!) 51   Temp 97.9 F (36.6 C)   Ht 5' 2.1" (1.577 m)   Wt 142 lb 8 oz (64.6 kg)   LMP  (LMP Unknown)   SpO2 95%   BMI 25.98 kg/m   Wt Readings from Last 3 Encounters:  05/19/16 142 lb 8 oz (64.6 kg)  11/03/15 140 lb (63.5 kg)  05/06/15 139 lb 12.8 oz (63.4 kg)    Physical Exam  Constitutional: She is oriented to person, place, and time. She appears well-developed and well-nourished.  HENT:  Head: Normocephalic and atraumatic.  Eyes: Pupils are equal, round, and reactive to light. Right eye exhibits no discharge. Left eye exhibits no discharge. No scleral icterus.  Neck: Normal range of motion. Neck supple. Carotid bruit is not present. No thyromegaly present.  Cardiovascular: Normal rate, regular rhythm and normal heart sounds.  Exam reveals no gallop and no friction rub.   No murmur heard. Pulmonary/Chest: Effort normal and breath sounds normal. No respiratory distress. She has no wheezes. She has no rales.  Musculoskeletal: Normal range of motion.  Lymphadenopathy:    She has no cervical adenopathy.  Neurological: She is alert and oriented to person, place, and time.  Skin: Skin is warm,  dry and intact. No rash noted.  Psychiatric: She has a normal mood and affect. Her speech is normal and behavior is normal. Judgment and thought content normal. Cognition and memory are normal.   Refusing breast exam and bone density  Results for orders placed or performed in visit on 05/06/15  Lipid Panel Piccolo, Arrow ElectronicsWaived  Result Value Ref Range   Cholesterol Piccolo, Waived 123 <200 mg/dL   HDL Chol Piccolo, Waived 30 (L) >59 mg/dL   Triglycerides Piccolo,Waived 85 <150 mg/dL   Chol/HDL Ratio Piccolo,Waive 4.1 mg/dL   LDL Chol Calc Piccolo Waived 76 <100 mg/dL   VLDL Chol Calc Piccolo,Waive 17 <30 mg/dL  Uric  acid  Result Value Ref Range   Uric Acid 4.8 2.5 - 7.1 mg/dL  Comprehensive metabolic panel  Result Value Ref Range   Glucose 93 65 - 99 mg/dL   BUN 13 8 - 27 mg/dL   Creatinine, Ser 1.610.76 0.57 - 1.00 mg/dL   GFR calc non Af Amer 76 >59 mL/min/1.73   GFR calc Af Amer 88 >59 mL/min/1.73   BUN/Creatinine Ratio 17 11 - 26   Sodium 140 134 - 144 mmol/L   Potassium 4.4 3.5 - 5.2 mmol/L   Chloride 100 97 - 108 mmol/L   CO2 22 18 - 29 mmol/L   Calcium 9.6 8.7 - 10.3 mg/dL   Total Protein 6.6 6.0 - 8.5 g/dL   Albumin 4.2 3.5 - 4.8 g/dL   Globulin, Total 2.4 1.5 - 4.5 g/dL   Albumin/Globulin Ratio 1.8 1.1 - 2.5   Bilirubin Total 0.8 0.0 - 1.2 mg/dL   Alkaline Phosphatase 111 39 - 117 IU/L   AST 19 0 - 40 IU/L   ALT 7 0 - 32 IU/L      Assessment & Plan:   Problem List Items Addressed This Visit      Unprioritized   Hyperlipidemia   Relevant Medications   losartan (COZAAR) 100 MG tablet   atorvastatin (LIPITOR) 10 MG tablet   Other Relevant Orders   Lipid Panel w/o Chol/HDL Ratio   Hypertension   Relevant Medications   losartan (COZAAR) 100 MG tablet   atorvastatin (LIPITOR) 10 MG tablet   Other Relevant Orders   Comprehensive metabolic panel    Other Visit Diagnoses    Need for pneumococcal vaccination    -  Primary   Relevant Orders   Pneumococcal polysaccharide vaccine 23-valent greater than or equal to 2yo subcutaneous/IM (Completed)   Medication monitoring encounter       Relevant Orders   Levetiracetam level   Seizure disorder (HCC)           Follow up plan: Return in about 6 months (around 11/16/2016).

## 2016-05-19 NOTE — Patient Instructions (Signed)
Pneumococcal Polysaccharide Vaccine: What You Need to Know  1. Why get vaccinated?  Vaccination can protect older adults (and some children and younger adults) from pneumococcal disease.  Pneumococcal disease is caused by bacteria that can spread from person to person through close contact. It can cause ear infections, and it can also lead to more serious infections of the:   · Lungs (pneumonia),  · Blood (bacteremia), and  · Covering of the brain and spinal cord (meningitis). Meningitis can cause deafness and brain damage, and it can be fatal.  Anyone can get pneumococcal disease, but children under 2 years of age, people with certain medical conditions, adults over 65 years of age, and cigarette smokers are at the highest risk.  About 18,000 older adults die each year from pneumococcal disease in the [place].  Treatment of pneumococcal infections with penicillin and other drugs used to be more effective. But some strains of the disease have become resistant to these drugs. This makes prevention of the disease, through vaccination, even more important.  2. Pneumococcal polysaccharide vaccine (PPSV23)  Pneumococcal polysaccharide vaccine (PPSV23) protects against 23 types of pneumococcal bacteria. It will not prevent all pneumococcal disease.  PPSV23 is recommended for:  · All adults 65 years of age and older,  · Anyone 2 through 77 years of age with certain long-term health problems,  · Anyone 2 through 77 years of age with a weakened immune system,  · Adults 19 through 77 years of age who smoke cigarettes or have asthma.  Most people need only one dose of PPSV. A second dose is recommended for certain high-risk groups. People 65 and older should get a dose even if they have gotten one or more doses of the vaccine before they turned 65.  Your healthcare provider can give you more information about these recommendations.  Most healthy adults develop protection within 2 to 3 weeks of getting the shot.  3. Some  people should not get this vaccine  · Anyone who has had a life-threatening allergic reaction to PPSV should not get another dose.  · Anyone who has a severe allergy to any component of PPSV should not receive it. Tell your provider if you have any severe allergies.  · Anyone who is moderately or severely ill when the shot is scheduled may be asked to wait until they recover before getting the vaccine. Someone with a mild illness can usually be vaccinated.  · Children less than 2 years of age should not receive this vaccine.  · There is no evidence that PPSV is harmful to either a pregnant woman or to her fetus. However, as a precaution, women who need the vaccine should be vaccinated before becoming pregnant, if possible.  4. Risks of a vaccine reaction  With any medicine, including vaccines, there is a chance of side effects. These are usually mild and go away on their own, but serious reactions are also possible.  About half of people who get PPSV have mild side effects, such as redness or pain where the shot is given, which go away within about two days.  Less than 1 out of 100 people develop a fever, muscle aches, or more severe local reactions.  Problems that could happen after any vaccine:  · People sometimes faint after a medical procedure, including vaccination. Sitting or lying down for about 15 minutes can help prevent fainting, and injuries caused by a fall. Tell your doctor if you feel dizzy, or have vision changes or   ringing in the ears.  · Some people get severe pain in the shoulder and have difficulty moving the arm where a shot was given. This happens very rarely.  · Any medication can cause a severe allergic reaction. Such reactions from a vaccine are very rare, estimated at about 1 in a million doses, and would happen within a few minutes to a few hours after the vaccination.  As with any medicine, there is a very remote chance of a vaccine causing a serious injury or death.  The safety of  vaccines is always being monitored. For more information, visit: www.cdc.gov/vaccinesafety/  5. What if there is a serious reaction?  What should I look for?  Look for anything that concerns you, such as signs of a severe allergic reaction, very high fever, or unusual behavior.   Signs of a severe allergic reaction can include hives, swelling of the face and throat, difficulty breathing, a fast heartbeat, dizziness, and weakness. These would usually start a few minutes to a few hours after the vaccination.  What should I do?  If you think it is a severe allergic reaction or other emergency that can't wait, call 9-1-1 or get to the nearest hospital. Otherwise, call your doctor.  Afterward, the reaction should be reported to the Vaccine Adverse Event Reporting System (VAERS). Your doctor might file this report, or you can do it yourself through the VAERS web site at www.vaers.hhs.gov, or by calling 1-800-822-7967.   VAERS does not give medical advice.  6. How can I learn more?  · Ask your doctor. He or she can give you the vaccine package insert or suggest other sources of information.  · Call your local or state health department.  · Contact the Centers for Disease Control and Prevention (CDC):    Call 1-800-232-4636 (1-800-CDC-INFO) or    Visit CDC's website at www.cdc.gov/vaccines  CDC Pneumococcal Polysaccharide Vaccine VIS (12/21/13)     This information is not intended to replace advice given to you by your health care provider. Make sure you discuss any questions you have with your health care provider.     Document Released: 06/13/2006 Document Revised: 09/06/2014 Document Reviewed: 12/24/2013  Elsevier Interactive Patient Education ©2016 [employer]

## 2016-05-20 ENCOUNTER — Encounter: Payer: Self-pay | Admitting: Unknown Physician Specialty

## 2016-05-21 LAB — COMPREHENSIVE METABOLIC PANEL
ALBUMIN: 4.3 g/dL (ref 3.5–4.8)
ALT: 8 IU/L (ref 0–32)
AST: 15 IU/L (ref 0–40)
Albumin/Globulin Ratio: 1.8 (ref 1.2–2.2)
Alkaline Phosphatase: 108 IU/L (ref 39–117)
BUN / CREAT RATIO: 18 (ref 12–28)
BUN: 16 mg/dL (ref 8–27)
Bilirubin Total: 0.8 mg/dL (ref 0.0–1.2)
CALCIUM: 9.1 mg/dL (ref 8.7–10.3)
CHLORIDE: 107 mmol/L — AB (ref 96–106)
CO2: 25 mmol/L (ref 18–29)
Creatinine, Ser: 0.87 mg/dL (ref 0.57–1.00)
GFR, EST AFRICAN AMERICAN: 74 mL/min/{1.73_m2} (ref >59)
GFR, EST NON AFRICAN AMERICAN: 64 mL/min/{1.73_m2} (ref >59)
GLOBULIN, TOTAL: 2.4 g/dL (ref 1.5–4.5)
GLUCOSE: 96 mg/dL (ref 65–99)
POTASSIUM: 4.2 mmol/L (ref 3.5–5.2)
Sodium: 146 mmol/L — ABNORMAL HIGH (ref 134–144)
TOTAL PROTEIN: 6.7 g/dL (ref 6.0–8.5)

## 2016-05-21 LAB — LIPID PANEL W/O CHOL/HDL RATIO
Cholesterol, Total: 124 mg/dL (ref 100–199)
HDL: 32 mg/dL — ABNORMAL LOW (ref >39)
LDL CALC: 79 mg/dL (ref 0–99)
TRIGLYCERIDES: 67 mg/dL (ref 0–149)
VLDL Cholesterol Cal: 13 mg/dL (ref 5–40)

## 2016-05-21 LAB — LEVETIRACETAM LEVEL: LEVETIRACETAM: 29.4 ug/mL (ref 10.0–40.0)

## 2016-07-09 DIAGNOSIS — Z8249 Family history of ischemic heart disease and other diseases of the circulatory system: Secondary | ICD-10-CM | POA: Diagnosis not present

## 2016-07-09 DIAGNOSIS — G40909 Epilepsy, unspecified, not intractable, without status epilepticus: Secondary | ICD-10-CM | POA: Diagnosis not present

## 2016-07-09 DIAGNOSIS — G40109 Localization-related (focal) (partial) symptomatic epilepsy and epileptic syndromes with simple partial seizures, not intractable, without status epilepticus: Secondary | ICD-10-CM | POA: Diagnosis not present

## 2016-07-09 DIAGNOSIS — Z79899 Other long term (current) drug therapy: Secondary | ICD-10-CM | POA: Diagnosis not present

## 2016-07-09 DIAGNOSIS — E785 Hyperlipidemia, unspecified: Secondary | ICD-10-CM | POA: Diagnosis not present

## 2016-07-09 DIAGNOSIS — M1990 Unspecified osteoarthritis, unspecified site: Secondary | ICD-10-CM | POA: Diagnosis not present

## 2016-07-09 DIAGNOSIS — Z7982 Long term (current) use of aspirin: Secondary | ICD-10-CM | POA: Diagnosis not present

## 2016-07-09 DIAGNOSIS — I10 Essential (primary) hypertension: Secondary | ICD-10-CM | POA: Diagnosis not present

## 2016-11-17 ENCOUNTER — Ambulatory Visit: Payer: Medicare PPO | Admitting: Unknown Physician Specialty

## 2016-11-23 ENCOUNTER — Ambulatory Visit (INDEPENDENT_AMBULATORY_CARE_PROVIDER_SITE_OTHER): Payer: Medicare PPO | Admitting: Unknown Physician Specialty

## 2016-11-23 ENCOUNTER — Encounter: Payer: Self-pay | Admitting: Unknown Physician Specialty

## 2016-11-23 DIAGNOSIS — E7800 Pure hypercholesterolemia, unspecified: Secondary | ICD-10-CM | POA: Diagnosis not present

## 2016-11-23 DIAGNOSIS — I10 Essential (primary) hypertension: Secondary | ICD-10-CM

## 2016-11-23 DIAGNOSIS — Z7189 Other specified counseling: Secondary | ICD-10-CM | POA: Insufficient documentation

## 2016-11-23 MED ORDER — LOSARTAN POTASSIUM 100 MG PO TABS: 100.0000 mg | ORAL_TABLET | Freq: Every day | ORAL | 1 refills | Status: DC

## 2016-11-23 MED ORDER — ATORVASTATIN CALCIUM 10 MG PO TABS: 10.0000 mg | ORAL_TABLET | Freq: Every day | ORAL | 1 refills | Status: DC

## 2016-11-23 NOTE — Progress Notes (Signed)
BP (!) 176/97 (BP Location: Left Arm, Cuff Size: Small)   Pulse (!) 57   Temp 97.4 F (36.3 C)   Wt 144 lb (65.3 kg)   LMP  (LMP Unknown)   SpO2 96%   BMI 26.25 kg/m    Subjective:    Patient ID: Renee Lopez, female    DOB: 02/21/1941, 76 y.o.   MRN: [id number]  HPI: Renee Lopez is a 76 y.o. female  Chief Complaint  Patient presents with  . Hyperlipidemia  . 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 Interim medical history since our last visit reviewed. Allergies and medications reviewed and updated.  Review of Systems  Per HPI unless specifically indicated above     Objective:    BP (!) 176/97 (BP Location: Left Arm, Cuff Size: Small)   Pulse (!) 57   Temp 97.4 F (36.3 C)   Wt 144 lb (65.3 kg)   LMP  (LMP Unknown)   SpO2 96%   BMI 26.25 kg/m   Wt Readings from Last 3 Encounters:  11/23/16 144 lb (65.3 kg)  05/19/16 142 lb 8 oz (64.6 kg)  11/03/15 140 lb (63.5 kg)    Physical Exam  Constitutional: She is oriented to person, place, and time. She appears well-developed and well-nourished. No distress.  HENT:  Head: Normocephalic and atraumatic.  Eyes: Conjunctivae and lids are normal. Right eye exhibits no discharge. Left eye exhibits no discharge. No scleral icterus.  Neck: Normal range of motion. Neck supple. No JVD present. Carotid bruit is not present.  Cardiovascular: Normal rate, regular rhythm and normal heart sounds.   Pulmonary/Chest: Effort normal and breath sounds normal.  Abdominal: Normal appearance. There is no splenomegaly or  hepatomegaly.  Musculoskeletal: Normal range of motion.  Neurological: She is alert and oriented to person, place, and time.  Skin: Skin is warm, dry and intact. No rash noted. No pallor.  Psychiatric: She has a normal mood and affect. Her behavior is normal. Judgment and thought content normal.    Results for orders placed or performed in visit on 05/19/16  Comprehensive metabolic panel  Result Value Ref Range   Glucose 96 65 - 99 mg/dL   BUN 16 8 - 27 mg/dL   Creatinine, Ser 7.820.87 0.57 - 1.00 mg/dL   GFR calc non Af Amer 64 >59 mL/min/1.73   GFR calc Af Amer 74 >59 mL/min/1.73   BUN/Creatinine Ratio 18 12 - 28   Sodium 146 (H) 134 - 144 mmol/L   Potassium 4.2 3.5 - 5.2 mmol/L   Chloride 107 (H) 96 - 106 mmol/L   CO2 25 18 - 29 mmol/L   Calcium 9.1 8.7 - 10.3 mg/dL   Total Protein 6.7 6.0 - 8.5 g/dL   Albumin 4.3 3.5 - 4.8 g/dL   Globulin, Total 2.4 1.5 - 4.5 g/dL   Albumin/Globulin Ratio 1.8 1.2 - 2.2   Bilirubin Total 0.8 0.0 - 1.2 mg/dL   Alkaline Phosphatase 108 39 - 117 IU/L   AST 15 0 - 40 IU/L   ALT 8 0 - 32 IU/L  Lipid Panel w/o Chol/HDL Ratio  Result  Value Ref Range   Cholesterol, Total 124 100 - 199 mg/dL   Triglycerides 67 0 - 149 mg/dL   HDL 32 (L) >16 mg/dL   VLDL Cholesterol Cal 13 5 - 40 mg/dL   LDL Calculated 79 0 - 99 mg/dL  Levetiracetam level  Result Value Ref Range   Levetiracetam Lvl 29.4 10.0 - 40.0 ug/mL      Assessment & Plan:   Problem List Items Addressed This Visit      Unprioritized   Advanced care planning/counseling discussion    A voluntary discussion about advance care planning including the explanation and discussion of advance directives was extensively discussed  with the patient.  Explanation about the health care proxy and Living will was reviewed and packet with forms with explanation of how to fill them out was given.  During this discussion, the patient was able to identify a health care proxy as her daughter and plans to fill out  the paperwork required.  Patient was offered a separate Advance Care Planning visit for further assistance with forms.         Hyperlipidemia    No changes today.  Continue present medications      Hypertension    Poor control in office but excellent numbers at home.  Will make no changes today.  Next time bring home cuff in with her          Follow up plan: Return in about 6 months (around 05/26/2017) for physical.

## 2016-11-23 NOTE — Assessment & Plan Note (Signed)
No changes today.  Continue present medications

## 2016-11-23 NOTE — Assessment & Plan Note (Signed)
A voluntary discussion about advance care planning including the explanation and discussion of advance directives was extensively discussed  with the patient.  Explanation about the health care proxy and Living will was reviewed and packet with forms with explanation of how to fill them out was given.  During this discussion, the patient was able to identify a health care proxy as her daughter and plans to fill out the paperwork required.  Patient was offered a separate Advance Care Planning visit for further assistance with forms.    

## 2016-11-23 NOTE — Assessment & Plan Note (Signed)
Poor control in office but excellent numbers at home.  Will make no changes today.  Next time bring home cuff in with her

## 2017-03-18 ENCOUNTER — Encounter: Payer: Self-pay | Admitting: Unknown Physician Specialty

## 2017-03-18 ENCOUNTER — Ambulatory Visit (INDEPENDENT_AMBULATORY_CARE_PROVIDER_SITE_OTHER): Payer: Medicare PPO | Admitting: Unknown Physician Specialty

## 2017-03-18 DIAGNOSIS — M1712 Unilateral primary osteoarthritis, left knee: Secondary | ICD-10-CM | POA: Diagnosis not present

## 2017-03-18 MED ORDER — MELOXICAM 15 MG PO TABS: 15.0000 mg | ORAL_TABLET | Freq: Every day | ORAL | 1 refills | Status: DC

## 2017-03-18 NOTE — Progress Notes (Signed)
BP (!) 168/90 (BP Location: Left Arm, Cuff Size: Normal)   Pulse 63   Temp 97.9 F (36.6 C)   Ht 5' 2.3" (1.582 m)   Wt 142 lb 1.6 oz (64.5 kg)   LMP  (LMP Unknown)   SpO2 98%   BMI 25.74 kg/m    Subjective:    Patient ID: Renee Lopez, female    DOB: 01/07/1941, 76 y.o.   MRN: [id number]  HPI: Renee Lopez is a 76 y.o. female  Chief Complaint  Patient presents with  . Knee Pain    pt states she heard something pop in her right knee a week and a half ago, states it has been hurting since    Knee pain Pt with right knee pain.  It popped about 2 days ago.  Worse when sitting and then trying to get up. 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     Relevant past medical, surgical, family and social history reviewed and updated as indicated. Interim medical history since our last visit reviewed. Allergies and medications reviewed and updated.  Review of Systems  Per HPI unless specifically indicated above     Objective:    BP (!) 168/90 (BP Location: Left Arm, Cuff Size: Normal)   Pulse 63   Temp 97.9 F (36.6 C)   Ht 5' 2.3" (1.582 m)   Wt 142 lb 1.6 oz (64.5 kg)   LMP  (LMP Unknown)   SpO2 98%   BMI 25.74 kg/m   Wt Readings from Last 3 Encounters:  03/18/17 142 lb 1.6 oz (64.5 kg)  11/23/16 144 lb (65.3 kg)  05/19/16 142 lb 8 oz (64.6 kg)    Physical Exam  Constitutional: She is oriented to person, place, and time. She appears well-developed and well-nourished. No distress.  HENT:  Head: Normocephalic and atraumatic.  Eyes: Conjunctivae and lids are normal. Right eye exhibits no discharge. Left eye exhibits no discharge. No scleral icterus.  Neck: Normal range of motion. Neck supple. No JVD present. Carotid bruit is not present.  Cardiovascular: Normal rate, regular rhythm and normal heart sounds.   Pulmonary/Chest: Effort normal and breath sounds normal.  Abdominal: Normal appearance. There is no splenomegaly or hepatomegaly.  Musculoskeletal:    Right knee: She exhibits decreased range of motion and swelling. She exhibits no deformity and no LCL laxity.  Neurological: She is alert and oriented to person, place, and time.  Skin: Skin is warm, dry and intact. No rash noted. No pallor.  Psychiatric: She has a normal mood and affect. Her behavior is normal. Judgment and thought content normal.    Results for orders placed or performed in visit on 05/19/16  Comprehensive metabolic panel  Result Value Ref Range   Glucose 96 65 - 99 mg/dL   BUN 16 8 - 27 mg/dL   Creatinine, Ser 4.090.87 0.57 - 1.00 mg/dL   GFR calc non Af Amer 64 >59 mL/min/1.73   GFR calc Af Amer 74 >59 mL/min/1.73   BUN/Creatinine Ratio 18 12 - 28   Sodium 146 (H) 134 - 144 mmol/L   Potassium 4.2 3.5 - 5.2 mmol/L   Chloride 107 (H) 96 - 106 mmol/L   CO2 25 18 - 29 mmol/L   Calcium 9.1 8.7 - 10.3 mg/dL   Total Protein 6.7 6.0 - 8.5 g/dL   Albumin 4.3 3.5 - 4.8 g/dL   Globulin, Total 2.4 1.5 - 4.5 g/dL   Albumin/Globulin Ratio 1.8 1.2 - 2.2  Bilirubin Total 0.8 0.0 - 1.2 mg/dL   Alkaline Phosphatase 108 39 - 117 IU/L   AST 15 0 - 40 IU/L   ALT 8 0 - 32 IU/L  Lipid Panel w/o Chol/HDL Ratio  Result Value Ref Range   Cholesterol, Total 124 100 - 199 mg/dL   Triglycerides 67 0 - 149 mg/dL   HDL 32 (L) >16 mg/dL   VLDL Cholesterol Cal 13 5 - 40 mg/dL   LDL Calculated 79 0 - 99 mg/dL  Levetiracetam level  Result Value Ref Range   Levetiracetam Lvl 29.4 10.0 - 40.0 ug/mL      Assessment & Plan:   Problem List Items Addressed This Visit      Unprioritized   Osteoarthritis    Will rx Mobic for knee pain.  RTC for injection if needed      Relevant Medications   meloxicam (MOBIC) 15 MG tablet       Follow up plan: Return if symptoms worsen or fail to improve.

## 2017-03-18 NOTE — Assessment & Plan Note (Addendum)
Will rx Mobic for knee pain.  RTC for injection if needed

## 2017-05-04 ENCOUNTER — Ambulatory Visit (INDEPENDENT_AMBULATORY_CARE_PROVIDER_SITE_OTHER): Payer: Medicare PPO

## 2017-05-04 VITALS — BP 160/88 | HR 59 | Temp 97.5°F | Resp 16 | Ht 62.0 in | Wt 144.2 lb

## 2017-05-04 DIAGNOSIS — Z0000 Encounter for general adult medical examination without abnormal findings: Secondary | ICD-10-CM | POA: Diagnosis not present

## 2017-05-04 NOTE — Progress Notes (Signed)
Subjective:   Renee Lopez is a 76 y.o. female who presents for Medicare Annual (Subsequent) preventive examination.  Review of Systems:  Cardiac Risk Factors include: advanced age (>1655men, 45>65 women);dyslipidemia;hypertension    Objective:     Vitals: BP (!) 160/88 (BP Location: Left Arm, Cuff Size: Normal)   Pulse (!) 59   Temp (!) 97.5 F (36.4 C)   Resp 16   Ht 5\' 2"  (1.575 m)   Wt 144 lb 3.2 oz (65.4 kg)   LMP  (LMP Unknown)   BMI 26.37 kg/m   Body mass index is 26.37 kg/m.   Tobacco History  Smoking Status  . Never Smoker  Smokeless Tobacco  . Never Used     Counseling given: Not Answered   Past Medical History:  Diagnosis Date  . Arthritis   . Hyperlipidemia   . Hypertension   . Seizures (HCC)    Past Surgical History:  Procedure Laterality Date  . CHOLECYSTECTOMY     Family History  Problem Relation Age of Onset  . Seizures Mother   . Hyperlipidemia Mother   . Hypertension Mother   . Cancer Father        colon  . Hypertension Father    History  Sexual Activity  . Sexual activity: Yes    Outpatient Encounter Prescriptions as of 05/04/2017  Medication Sig  . aspirin 81 MG tablet Take 81 mg by mouth daily.  Marland Kitchen. atorvastatin (LIPITOR) 10 MG tablet Take 1 tablet (10 mg total) by mouth daily at 6 PM.  . levETIRAcetam (KEPPRA) 500 MG tablet Take 1 tablet (500 mg total) by mouth 2 (two) times daily.  Marland Kitchen. losartan (COZAAR) 100 MG tablet Take 1 tablet (100 mg total) by mouth daily.  . meloxicam (MOBIC) 15 MG tablet Take 1 tablet (15 mg total) by mouth daily.   No facility-administered encounter medications on file as of 05/04/2017.     Activities of Daily Living In your present state of health, do you have any difficulty performing the following activities: 05/04/2017 05/19/2016  Hearing? N N  Vision? N N  Difficulty concentrating or making decisions? N N  Walking or climbing stairs? Y N  Dressing or bathing? N N  Doing errands, shopping? N N    Preparing Food and eating ? N -  Using the Toilet? N -  In the past six months, have you accidently leaked urine? N -  Do you have problems with loss of bowel control? N -  Managing your Medications? N -  Managing your Finances? N -  Housekeeping or managing your Housekeeping? N -  Some recent data might be hidden    Patient Care Team: Gabriel CirriWicker, Cheryl, NP as PCP - General (Nurse Practitioner)    Assessment:     Exercise Activities and Dietary recommendations Current Exercise Habits: Home exercise routine, Type of exercise: stretching;strength training/weights, Time (Minutes): 10, Frequency (Times/Week): 7, Weekly Exercise (Minutes/Week): 70, Intensity: Mild, Exercise limited by: None identified  Goals    None     Fall Risk Fall Risk  05/04/2017 05/19/2016  Falls in the past year? No No   Depression Screen PHQ 2/9 Scores 05/04/2017 05/19/2016  PHQ - 2 Score 0 0  PHQ- 9 Score - 0     Cognitive Function     6CIT Screen 05/04/2017  What Year? 0 points  What month? 0 points  What time? 0 points  Count back from 20 0 points  Months in reverse 0 points  Repeat phrase 2 points  Total Score 2    Immunization History  Administered Date(s) Administered  . Influenza-Unspecified 06/09/2015, 06/10/2016  . Pneumococcal Conjugate-13 05/10/2014  . Pneumococcal Polysaccharide-23 05/19/2016  . Td 01/07/2014   Screening Tests Health Maintenance  Topic Date Due  . DEXA SCAN  05/30/2017 (Originally 04/06/2004)  . INFLUENZA VACCINE  06/18/2017 (Originally 03/30/2017)  . TETANUS/TDAP  01/08/2024  . PNA vac Low Risk Adult  Completed      Plan:     I have personally reviewed and addressed the Medicare Annual Wellness questionnaire and have noted the following in the patient's chart:  A. Medical and social history B. Use of alcohol, tobacco or illicit drugs  C. Current medications and supplements D. Functional ability and status E.  Nutritional status F.  Physical activity G. Advance  directives H. List of other physicians I.  Hospitalizations, surgeries, and ER visits in previous 12 months J.  Vitals K. Screenings such as hearing and vision if needed, cognitive and depression L. Referrals and appointments   In addition, I have reviewed and discussed with patient certain preventive protocols, quality metrics, and best practice recommendations. A written personalized care plan for preventive services as well as general preventive health recommendations were provided to patient.   Signed,  Marin Roberts, LPN Nurse Health Advisor   MD Recommendations: none

## 2017-05-04 NOTE — Patient Instructions (Addendum)
[name] , Thank you for taking time to come for your Medicare Wellness Visit. I appreciate your ongoing commitment to your health goals. Please review the following plan we discussed and let me know if I can assist you in the future.   Screening recommendations/referrals: Colonoscopy: no longer required Mammogram: no longer required Bone Density: due- declined  Recommended yearly ophthalmology/optometry visit for glaucoma screening and checkup Recommended yearly dental visit for hygiene and checkup  Vaccinations: Influenza vaccine: due now- declined today  Pneumococcal vaccine: up to date Tdap vaccine: up to date Shingles vaccine: due, check with your insurance company for coverage  Advanced directives: Please bring a copy of your health care power of attorney and living will to the office at your convenience.  Conditions/risks identified: none   Next appointment: Follow up on 05/31/2017 at 10:00am with Renee Inchesheryl Wicker,NP. Follow up in one year for your annual wellness exam.    Preventive Care 65 Years and Older, Female Preventive care refers to lifestyle choices and visits with your health care provider that can promote health and wellness. What does preventive care include?  A yearly physical exam. This is also called an annual well check.  Dental exams once or twice a year.  Routine eye exams. Ask your health care provider how often you should have your eyes checked.  Personal lifestyle choices, including:  Daily care of your teeth and gums.  Regular physical activity.  Eating a healthy diet.  Avoiding tobacco and drug use.  Limiting alcohol use.  Practicing safe sex.  Taking low-dose aspirin every day.  Taking vitamin and mineral supplements as recommended by your health care provider. What happens during an annual well check? The services and screenings done by your health care provider during your annual well check will depend on your age, overall health,  lifestyle risk factors, and family history of disease. Counseling  Your health care provider may ask you questions about your:  Alcohol use.  Tobacco use.  Drug use.  Emotional well-being.  Home and relationship well-being.  Sexual activity.  Eating habits.  History of falls.  Memory and ability to understand (cognition).  Work and work Astronomerenvironment.  Reproductive health. Screening  You may have the following tests or measurements:  Height, weight, and BMI.  Blood pressure.  Lipid and cholesterol levels. These may be checked every 5 years, or more frequently if you are over 78 years old.  Skin check.  Lung cancer screening. You may have this screening every year starting at age 78 if you have a 30-pack-year history of smoking and currently smoke or have quit within the past 15 years.  Fecal occult blood test (FOBT) of the stool. You may have this test every year starting at age 78.  Flexible sigmoidoscopy or colonoscopy. You may have a sigmoidoscopy every 5 years or a colonoscopy every 10 years starting at age 78.  Hepatitis C blood test.  Hepatitis B blood test.  Sexually transmitted disease (STD) testing.  Diabetes screening. This is done by checking your blood sugar (glucose) after you have not eaten for a while (fasting). You may have this done every 1-3 years.  Bone density scan. This is done to screen for osteoporosis. You may have this done starting at age 78.  Mammogram. This may be done every 1-2 years. Talk to your health care provider about how often you should have regular mammograms. Talk with your health care provider about your test results, treatment options, and if necessary, the need  for more tests. Vaccines  Your health care provider may recommend certain vaccines, such as:  Influenza vaccine. This is recommended every year.  Tetanus, diphtheria, and acellular pertussis (Tdap, Td) vaccine. You may need a Td booster every 10 years.  Zoster  vaccine. You may need this after age 73.  Pneumococcal 13-valent conjugate (PCV13) vaccine. One dose is recommended after age 37.  Pneumococcal polysaccharide (PPSV23) vaccine. One dose is recommended after age 52. Talk to your health care provider about which screenings and vaccines you need and how often you need them. This information is not intended to replace advice given to you by your health care provider. Make sure you discuss any questions you have with your health care provider. Document Released: 09/12/2015 Document Revised: 05/05/2016 Document Reviewed: 06/17/2015 Elsevier Interactive Patient Education  2017 [place] Prevention in the Home Falls can cause injuries. They can happen to people of all ages. There are many things you can do to make your home safe and to help prevent falls. What can I do on the outside of my home?  Regularly fix the edges of walkways and driveways and fix any cracks.  Remove anything that might make you trip as you walk through a door, such as a raised step or threshold.  Trim any bushes or trees on the path to your home.  Use bright outdoor lighting.  Clear any walking paths of anything that might make someone trip, such as rocks or tools.  Regularly check to see if handrails are loose or broken. Make sure that both sides of any steps have handrails.  Any raised decks and porches should have guardrails on the edges.  Have any leaves, snow, or ice cleared regularly.  Use sand or salt on walking paths during winter.  Clean up any spills in your garage right away. This includes oil or grease spills. What can I do in the bathroom?  Use night lights.  Install grab bars by the toilet and in the tub and shower. Do not use towel bars as grab bars.  Use non-skid mats or decals in the tub or shower.  If you need to sit down in the shower, use a plastic, non-slip stool.  Keep the floor dry. Clean up any water that spills on the  floor as soon as it happens.  Remove soap buildup in the tub or shower regularly.  Attach bath mats securely with double-sided non-slip rug tape.  Do not have throw rugs and other things on the floor that can make you trip. What can I do in the bedroom?  Use night lights.  Make sure that you have a light by your bed that is easy to reach.  Do not use any sheets or blankets that are too big for your bed. They should not hang down onto the floor.  Have a firm chair that has side arms. You can use this for support while you get dressed.  Do not have throw rugs and other things on the floor that can make you trip. What can I do in the kitchen?  Clean up any spills right away.  Avoid walking on wet floors.  Keep items that you use a lot in easy-to-reach places.  If you need to reach something above you, use a strong step stool that has a grab bar.  Keep electrical cords out of the way.  Do not use floor polish or wax that makes floors slippery. If you must use wax, use  non-skid floor wax.  Do not have throw rugs and other things on the floor that can make you trip. What can I do with my stairs?  Do not leave any items on the stairs.  Make sure that there are handrails on both sides of the stairs and use them. Fix handrails that are broken or loose. Make sure that handrails are as long as the stairways.  Check any carpeting to make sure that it is firmly attached to the stairs. Fix any carpet that is loose or worn.  Avoid having throw rugs at the top or bottom of the stairs. If you do have throw rugs, attach them to the floor with carpet tape.  Make sure that you have a light switch at the top of the stairs and the bottom of the stairs. If you do not have them, ask someone to add them for you. What else can I do to help prevent falls?  Wear shoes that:  Do not have high heels.  Have rubber bottoms.  Are comfortable and fit you well.  Are closed at the toe. Do not wear  sandals.  If you use a stepladder:  Make sure that it is fully opened. Do not climb a closed stepladder.  Make sure that both sides of the stepladder are locked into place.  Ask someone to hold it for you, if possible.  Clearly mark and make sure that you can see:  Any grab bars or handrails.  First and last steps.  Where the edge of each step is.  Use tools that help you move around (mobility aids) if they are needed. These include:  Canes.  Walkers.  Scooters.  Crutches.  Turn on the lights when you go into a dark area. Replace any light bulbs as soon as they burn out.  Set up your furniture so you have a clear path. Avoid moving your furniture around.  If any of your floors are uneven, fix them.  If there are any pets around you, be aware of where they are.  Review your medicines with your doctor. Some medicines can make you feel dizzy. This can increase your chance of falling. Ask your doctor what other things that you can do to help prevent falls. This information is not intended to replace advice given to you by your health care provider. Make sure you discuss any questions you have with your health care provider. Document Released: 06/12/2009 Document Revised: 01/22/2016 Document Reviewed: 09/20/2014 Elsevier Interactive Patient Education  2017 [employer].

## 2017-05-12 ENCOUNTER — Other Ambulatory Visit: Payer: Self-pay | Admitting: Unknown Physician Specialty

## 2017-05-31 ENCOUNTER — Ambulatory Visit (INDEPENDENT_AMBULATORY_CARE_PROVIDER_SITE_OTHER): Payer: Medicare PPO | Admitting: Unknown Physician Specialty

## 2017-05-31 ENCOUNTER — Encounter: Payer: Self-pay | Admitting: Unknown Physician Specialty

## 2017-05-31 VITALS — BP 162/89 | HR 54 | Temp 97.6°F | Ht 62.0 in | Wt 144.6 lb

## 2017-05-31 DIAGNOSIS — M170 Bilateral primary osteoarthritis of knee: Secondary | ICD-10-CM | POA: Diagnosis not present

## 2017-05-31 DIAGNOSIS — I10 Essential (primary) hypertension: Secondary | ICD-10-CM

## 2017-05-31 DIAGNOSIS — E7800 Pure hypercholesterolemia, unspecified: Secondary | ICD-10-CM

## 2017-05-31 DIAGNOSIS — Z5181 Encounter for therapeutic drug level monitoring: Secondary | ICD-10-CM

## 2017-05-31 MED ORDER — TRIAMCINOLONE ACETONIDE 40 MG/ML IJ SUSP
40.0000 mg | Freq: Once | INTRAMUSCULAR | Status: AC
  Administered 2017-05-31: 40 mg via INTRAMUSCULAR

## 2017-05-31 NOTE — Assessment & Plan Note (Signed)
Injection given.  Will see how she does with this injections and recheck prn

## 2017-05-31 NOTE — Progress Notes (Signed)
BP (!) 162/89 (BP Location: Left Arm, Cuff Size: Normal)   Pulse (!) 54   Temp 97.6 F (36.4 C)   Ht  (1.575 m)   Wt 144 lb 9.6 oz (65.6 kg)   LMP  (LMP Unknown)   SpO2 96%   BMI 26.45 kg/m    Subjective:    Patient ID: Renee Lopez, female    DOB: Jul 20, 1941, 76 y.o.   MRN: [id number]  HPI: Renee Lopez is a 76 y.o. female  Chief Complaint  Patient presents with  . Annual Exam    pt states she is getting her flu vaccine at her pharmacy  . Knee Pain    pt states she has been having right knee pain and meloxicam is not helping- wants injection if possible    Right knee  is given her a problem and would like an injection if possible.  Injection of left knee helped  Hypertension BP readings high in office.  Brought readings from home with BP 120-149 with numbers from home Using medications without difficulty  No problems or lightheadedness No chest pain with exertion or shortness of breath No Edema  Hyperlipidemia Using medications without problems: No Muscle aches  Diet compliance:Exercise: Eats well.  [place]  Seizure No seizures since on pills.    Relevant past medical, surgical, family and social history reviewed and updated as indicated. Interim medical history since our last visit reviewed. Allergies and medications reviewed and updated.  Review of Systems  Per HPI unless specifically indicated above     Objective:    BP (!) 162/89 (BP Location: Left Arm, Cuff Size: Normal)   Pulse (!) 54   Temp 97.6 F (36.4 C)   Ht  (1.575 m)   Wt 144 lb 9.6 oz (65.6 kg)   LMP  (LMP Unknown)   SpO2 96%   BMI 26.45 kg/m   Wt Readings from Last 3 Encounters:  05/31/17 144 lb 9.6 oz (65.6 kg)  05/04/17 144 lb 3.2 oz (65.4 kg)  03/18/17 142 lb 1.6 oz (64.5 kg)    Physical Exam  Constitutional: She is oriented to person, place, and time. She appears well-developed and well-nourished. No distress.  HENT:  Head: Normocephalic and  atraumatic.  Eyes: Conjunctivae and lids are normal. Right eye exhibits no discharge. Left eye exhibits no discharge. No scleral icterus.  Neck: Normal range of motion. Neck supple. No JVD present. Carotid bruit is not present.  Cardiovascular: Normal rate, regular rhythm and normal heart sounds.   Pulmonary/Chest: Effort normal and breath sounds normal.  Abdominal: Normal appearance. There is no splenomegaly or hepatomegaly.  Musculoskeletal: Normal range of motion.  Neurological: She is alert and oriented to person, place, and time.  Skin: Skin is warm, dry and intact. No rash noted. No pallor.  Psychiatric: She has a normal mood and affect. Her behavior is normal. Judgment and thought content normal.   STEROID INJECTION  Procedure: Knee Intraarticular Steroid Injection   Description: After verbal consent and patient ed on Area prepped and draped using  semi-sterile technique. Using a anterior  approach, a mixture of 4 cc of  1% Marcaine & 1 cc of Kenalog 40 was injected into knee joint.  A bandage was then placed over the injection site. Complications:  none Post Procedure Instructions: To the ER if any symptoms of erythema or swelling.   Follow Up: PRN     Assessment & Plan:   Problem List  Items Addressed This Visit      Unprioritized   Hyperlipidemia - Primary   Relevant Orders   Lipid Panel w/o Chol/HDL Ratio   Hypertension    Stable with good numbers at home, continue present medications.        Relevant Orders   Comprehensive metabolic panel   Osteoarthritis    Injection given.  Will see how she does with this injections and recheck prn      Relevant Medications   triamcinolone acetonide (KENALOG-40) injection 40 mg (Completed)    Other Visit Diagnoses    Medication monitoring encounter       Relevant Orders   Levetiracetam level       Follow up plan: Return in about 6 months (around 11/29/2017).

## 2017-05-31 NOTE — Assessment & Plan Note (Signed)
Stable with good numbers at home, continue present medications.

## 2017-05-31 NOTE — Patient Instructions (Addendum)
Preventive Care 65 Years and Older, Female Preventive care refers to lifestyle choices and visits with your health care provider that can promote health and wellness. What does preventive care include?  A yearly physical exam. This is also called an annual well check.  Dental exams once or twice a year.  Routine eye exams. Ask your health care provider how often you should have your eyes checked.  Personal lifestyle choices, including: ? Daily care of your teeth and gums. ? Regular physical activity. ? Eating a healthy diet. ? Avoiding tobacco and drug use. ? Limiting alcohol use. ? Practicing safe sex. ? Taking low-dose aspirin every day. ? Taking vitamin and mineral supplements as recommended by your health care provider. What happens during an annual well check? The services and screenings done by your health care provider during your annual well check will depend on your age, overall health, lifestyle risk factors, and family history of disease. Counseling Your health care provider may ask you questions about your:  Alcohol use.  Tobacco use.  Drug use.  Emotional well-being.  Home and relationship well-being.  Sexual activity.  Eating habits.  History of falls.  Memory and ability to understand (cognition).  Work and work environment.  Reproductive health.  Screening You may have the following tests or measurements:  Height, weight, and BMI.  Blood pressure.  Lipid and cholesterol levels. These may be checked every 5 years, or more frequently if you are over 50 years old.  Skin check.  Lung cancer screening. You may have this screening every year starting at age 55 if you have a 30-pack-year history of smoking and currently smoke or have quit within the past 15 years.  Fecal occult blood test (FOBT) of the stool. You may have this test every year starting at age 50.  Flexible sigmoidoscopy or colonoscopy. You may have a sigmoidoscopy every 5 years or  a colonoscopy every 10 years starting at age 50.  Hepatitis C blood test.  Hepatitis B blood test.  Sexually transmitted disease (STD) testing.  Diabetes screening. This is done by checking your blood sugar (glucose) after you have not eaten for a while (fasting). You may have this done every 1-3 years.  Bone density scan. This is done to screen for osteoporosis. You may have this done starting at age 78.  Mammogram. This may be done every 1-2 years. Talk to your health care provider about how often you should have regular mammograms.  Talk with your health care provider about your test results, treatment options, and if necessary, the need for more tests. Vaccines Your health care provider may recommend certain vaccines, such as:  Influenza vaccine. This is recommended every year.  Tetanus, diphtheria, and acellular pertussis (Tdap, Td) vaccine. You may need a Td booster every 10 years.  Varicella vaccine. You may need this if you have not been vaccinated.  Zoster vaccine. You may need this after age 60.  Measles, mumps, and rubella (MMR) vaccine. You may need at least one dose of MMR if you were born in 1957 or later. You may also need a second dose.  Pneumococcal 13-valent conjugate (PCV13) vaccine. One dose is recommended after age 78.  Pneumococcal polysaccharide (PPSV23) vaccine. One dose is recommended after age 78.  Meningococcal vaccine. You may need this if you have certain conditions.  Hepatitis A vaccine. You may need this if you have certain conditions or if you travel or work in places where you may be exposed to hepatitis   A.  Hepatitis B vaccine. You may need this if you have certain conditions or if you travel or work in places where you may be exposed to hepatitis B.  Haemophilus influenzae type b (Hib) vaccine. You may need this if you have certain conditions.  Talk to your health care provider about which screenings and vaccines you need and how often you  need them. This information is not intended to replace advice given to you by your health care provider. Make sure you discuss any questions you have with your health care provider. Document Released: 09/12/2015 Document Revised: 05/05/2016 Document Reviewed: 06/17/2015 Elsevier Interactive Patient Education  2017 [employer].

## 2017-06-01 ENCOUNTER — Encounter: Payer: Self-pay | Admitting: Unknown Physician Specialty

## 2017-06-02 LAB — COMPREHENSIVE METABOLIC PANEL
A/G RATIO: 2 (ref 1.2–2.2)
ALT: 11 IU/L (ref 0–32)
AST: 19 IU/L (ref 0–40)
Albumin: 4.4 g/dL (ref 3.5–4.8)
Alkaline Phosphatase: 118 IU/L — ABNORMAL HIGH (ref 39–117)
BUN / CREAT RATIO: 17 (ref 12–28)
BUN: 14 mg/dL (ref 8–27)
Bilirubin Total: 0.8 mg/dL (ref 0.0–1.2)
CALCIUM: 9.8 mg/dL (ref 8.7–10.3)
CO2: 26 mmol/L (ref 20–29)
CREATININE: 0.81 mg/dL (ref 0.57–1.00)
Chloride: 106 mmol/L (ref 96–106)
GFR, EST AFRICAN AMERICAN: 80 mL/min/{1.73_m2} (ref >59)
GFR, EST NON AFRICAN AMERICAN: 70 mL/min/{1.73_m2} (ref >59)
GLOBULIN, TOTAL: 2.2 g/dL (ref 1.5–4.5)
Glucose: 89 mg/dL (ref 65–99)
Potassium: 4.3 mmol/L (ref 3.5–5.2)
SODIUM: 143 mmol/L (ref 134–144)
Total Protein: 6.6 g/dL (ref 6.0–8.5)

## 2017-06-02 LAB — LIPID PANEL W/O CHOL/HDL RATIO
Cholesterol, Total: 137 mg/dL (ref 100–199)
HDL: 32 mg/dL — ABNORMAL LOW (ref >39)
LDL CALC: 87 mg/dL (ref 0–99)
TRIGLYCERIDES: 90 mg/dL (ref 0–149)
VLDL Cholesterol Cal: 18 mg/dL (ref 5–40)

## 2017-06-02 LAB — LEVETIRACETAM LEVEL: Levetiracetam Lvl: 29.8 ug/mL (ref 10.0–40.0)

## 2017-06-12 ENCOUNTER — Other Ambulatory Visit: Payer: Self-pay | Admitting: Family Medicine

## 2017-07-01 ENCOUNTER — Telehealth: Payer: Self-pay | Admitting: Unknown Physician Specialty

## 2017-07-01 NOTE — Telephone Encounter (Signed)
Chart updated with flu shot documentation.

## 2017-07-01 NOTE — Telephone Encounter (Signed)
Copied from CRM [id number]. Topic: Quick Communication - See Telephone Encounter >> Jul 01, 2017  3:45 PM Diana EvesHoyt, Maryann B wrote: CRM for notification. See Telephone encounter for:  07/01/17. Pt got a call thinking it was about her flu shot. Pt said she got flu shot last week at [facility] and also just wants to let the office know she is going to send her co pay of $16.00 in the mail

## 2017-07-14 ENCOUNTER — Other Ambulatory Visit: Payer: Self-pay | Admitting: Unknown Physician Specialty

## 2017-11-21 ENCOUNTER — Other Ambulatory Visit: Payer: Self-pay | Admitting: Unknown Physician Specialty

## 2017-11-29 ENCOUNTER — Encounter: Payer: Self-pay | Admitting: Unknown Physician Specialty

## 2017-11-29 ENCOUNTER — Ambulatory Visit (INDEPENDENT_AMBULATORY_CARE_PROVIDER_SITE_OTHER): Payer: Medicare PPO | Admitting: Unknown Physician Specialty

## 2017-11-29 DIAGNOSIS — I10 Essential (primary) hypertension: Secondary | ICD-10-CM | POA: Diagnosis not present

## 2017-11-29 DIAGNOSIS — E7800 Pure hypercholesterolemia, unspecified: Secondary | ICD-10-CM

## 2017-11-29 NOTE — Progress Notes (Signed)
BP (!) 196/92 (BP Location: Left Arm, Cuff Size: Normal)   Pulse (!) 55   Ht 5\' 2"  (1.575 m)   Wt 138 lb (62.6 kg)   LMP  (LMP Unknown)   SpO2 98%   BMI 25.24 kg/m    Subjective:    Patient ID: Renee CrandallMildred O Lopez, female    DOB: 09/23/1940, 77 y.o.   MRN: [id number]  HPI: Renee Lopez is a 77 y.o. female  Chief Complaint  Patient presents with  . Hypertension  . Hyperlipidemia   Hypertension Using medications without difficulty.  BP is very high here.  Known white coat syndrome Average home BPs typically below 140/90   No problems or lightheadedness No chest pain with exertion or shortness of breath No Edema  Hyperlipidemia Using medications without problems: No Muscle aches  Diet compliance:Exercise:   Relevant past medical, surgical, family and social history reviewed and updated as indicated. Interim medical history since our last visit reviewed. Allergies and medications reviewed and updated.  Review of Systems  Per HPI unless specifically indicated above     Objective:    BP (!) 196/92 (BP Location: Left Arm, Cuff Size: Normal)   Pulse (!) 55   Ht 5\' 2"  (1.575 m)   Wt 138 lb (62.6 kg)   LMP  (LMP Unknown)   SpO2 98%   BMI 25.24 kg/m   Wt Readings from Last 3 Encounters:  11/29/17 138 lb (62.6 kg)  05/31/17 144 lb 9.6 oz (65.6 kg)  05/04/17 144 lb 3.2 oz (65.4 kg)    Physical Exam  Constitutional: She is oriented to person, place, and time. She appears well-developed and well-nourished. No distress.  HENT:  Head: Normocephalic and atraumatic.  Eyes: Conjunctivae and lids are normal. Right eye exhibits no discharge. Left eye exhibits no discharge. No scleral icterus.  Neck: Normal range of motion. Neck supple. No JVD present. Carotid bruit is not present.  Cardiovascular: Normal rate, regular rhythm and normal heart sounds.  Pulmonary/Chest: Effort normal and breath sounds normal.  Abdominal: Normal appearance. There is no splenomegaly or  hepatomegaly.  Musculoskeletal: Normal range of motion.  Neurological: She is alert and oriented to person, place, and time.  Skin: Skin is warm, dry and intact. No rash noted. No pallor.  Psychiatric: She has a normal mood and affect. Her behavior is normal. Judgment and thought content normal.    Results for orders placed or performed in visit on 05/31/17  Levetiracetam level  Result Value Ref Range   Levetiracetam Lvl 29.8 10.0 - 40.0 ug/mL  Comprehensive metabolic panel  Result Value Ref Range   Glucose 89 65 - 99 mg/dL   BUN 14 8 - 27 mg/dL   Creatinine, Ser 7.820.81 0.57 - 1.00 mg/dL   GFR calc non Af Amer 70 >59 mL/min/1.73   GFR calc Af Amer 80 >59 mL/min/1.73   BUN/Creatinine Ratio 17 12 - 28   Sodium 143 134 - 144 mmol/L   Potassium 4.3 3.5 - 5.2 mmol/L   Chloride 106 96 - 106 mmol/L   CO2 26 20 - 29 mmol/L   Calcium 9.8 8.7 - 10.3 mg/dL   Total Protein 6.6 6.0 - 8.5 g/dL   Albumin 4.4 3.5 - 4.8 g/dL   Globulin, Total 2.2 1.5 - 4.5 g/dL   Albumin/Globulin Ratio 2.0 1.2 - 2.2   Bilirubin Total 0.8 0.0 - 1.2 mg/dL   Alkaline Phosphatase 118 (H) 39 - 117 IU/L   AST 19 0 -  40 IU/L   ALT 11 0 - 32 IU/L  Lipid Panel w/o Chol/HDL Ratio  Result Value Ref Range   Cholesterol, Total 137 100 - 199 mg/dL   Triglycerides 90 0 - 149 mg/dL   HDL 32 (L) >40 mg/dL   VLDL Cholesterol Cal 18 5 - 40 mg/dL   LDL Calculated 87 0 - 99 mg/dL      Assessment & Plan:   Problem List Items Addressed This Visit      Unprioritized   Hyperlipidemia    Stable, continue present medications.        Hypertension    Not to goal in office but good numbers at home.  Recheck in 6 months and continue checking at home.  Refusing EKG and chest x-ray          Follow up plan: Return in about 6 months (around 05/31/2018).

## 2017-11-29 NOTE — Assessment & Plan Note (Signed)
Stable, continue present medications.   

## 2017-11-29 NOTE — Assessment & Plan Note (Addendum)
Not to goal in office but good numbers at home.  Recheck in 6 months and continue checking at home.  Refusing EKG and chest x-ray

## 2018-03-06 ENCOUNTER — Emergency Department
Admission: EM | Admit: 2018-03-06 | Discharge: 2018-03-06 | Disposition: A | Discharge status: Home / Self Care | Payer: Medicare PPO | Attending: Emergency Medicine | Admitting: Emergency Medicine

## 2018-03-06 ENCOUNTER — Encounter: Payer: Self-pay | Admitting: *Deleted

## 2018-03-06 ENCOUNTER — Other Ambulatory Visit: Payer: Self-pay

## 2018-03-06 DIAGNOSIS — I10 Essential (primary) hypertension: Secondary | ICD-10-CM | POA: Insufficient documentation

## 2018-03-06 DIAGNOSIS — R110 Nausea: Secondary | ICD-10-CM | POA: Diagnosis not present

## 2018-03-06 DIAGNOSIS — R1031 Right lower quadrant pain: Secondary | ICD-10-CM | POA: Diagnosis not present

## 2018-03-06 DIAGNOSIS — Z79899 Other long term (current) drug therapy: Secondary | ICD-10-CM | POA: Insufficient documentation

## 2018-03-06 DIAGNOSIS — R1013 Epigastric pain: Secondary | ICD-10-CM | POA: Insufficient documentation

## 2018-03-06 DIAGNOSIS — Z7982 Long term (current) use of aspirin: Secondary | ICD-10-CM | POA: Diagnosis not present

## 2018-03-06 DIAGNOSIS — E782 Mixed hyperlipidemia: Secondary | ICD-10-CM | POA: Diagnosis not present

## 2018-03-06 DIAGNOSIS — Z0131 Encounter for examination of blood pressure with abnormal findings: Secondary | ICD-10-CM | POA: Diagnosis not present

## 2018-03-06 DIAGNOSIS — R112 Nausea with vomiting, unspecified: Secondary | ICD-10-CM | POA: Diagnosis not present

## 2018-03-06 LAB — COMPREHENSIVE METABOLIC PANEL
ALT: 18 U/L (ref 0–44)
ANION GAP: 7 (ref 5–15)
AST: 44 U/L — ABNORMAL HIGH (ref 15–41)
Albumin: 3.9 g/dL (ref 3.5–5.0)
Alkaline Phosphatase: 103 U/L (ref 38–126)
BUN: 14 mg/dL (ref 8–23)
CHLORIDE: 108 mmol/L (ref 98–111)
CO2: 28 mmol/L (ref 22–32)
Calcium: 9 mg/dL (ref 8.9–10.3)
Creatinine, Ser: 0.82 mg/dL (ref 0.44–1.00)
GFR calc non Af Amer: >60 mL/min (ref >60)
Glucose, Bld: 106 mg/dL — ABNORMAL HIGH (ref 70–99)
Potassium: 3.8 mmol/L (ref 3.5–5.1)
SODIUM: 143 mmol/L (ref 135–145)
Total Bilirubin: 0.7 mg/dL (ref 0.3–1.2)
Total Protein: 7.1 g/dL (ref 6.5–8.1)

## 2018-03-06 LAB — URINALYSIS, COMPLETE (UACMP) WITH MICROSCOPIC
BACTERIA UA: NONE SEEN
BILIRUBIN URINE: NEGATIVE
GLUCOSE, UA: NEGATIVE mg/dL
KETONES UR: 5 mg/dL — AB
LEUKOCYTES UA: NEGATIVE
NITRITE: NEGATIVE
PROTEIN: NEGATIVE mg/dL
Specific Gravity, Urine: 1.011 (ref 1.005–1.030)
pH: 6 (ref 5.0–8.0)

## 2018-03-06 LAB — CBC
HEMATOCRIT: 38.1 % (ref 35.0–47.0)
HEMOGLOBIN: 12.8 g/dL (ref 12.0–16.0)
MCH: 30.8 pg (ref 26.0–34.0)
MCHC: 33.6 g/dL (ref 32.0–36.0)
MCV: 91.4 fL (ref 80.0–100.0)
Platelets: 255 10*3/uL (ref 150–440)
RBC: 4.17 MIL/uL (ref 3.80–5.20)
RDW: 14.2 % (ref 11.5–14.5)
WBC: 5.7 10*3/uL (ref 3.6–11.0)

## 2018-03-06 LAB — LIPASE, BLOOD: Lipase: 26 U/L (ref 11–51)

## 2018-03-06 LAB — TROPONIN I

## 2018-03-06 MED ORDER — FAMOTIDINE 40 MG PO TABS: 40.0000 mg | ORAL_TABLET | Freq: Every evening | ORAL | 1 refills | Status: DC

## 2018-03-06 MED ORDER — SUCRALFATE 1 G PO TABS: 1.0000 g | ORAL_TABLET | Freq: Four times a day (QID) | ORAL | 0 refills | Status: DC

## 2018-03-06 NOTE — ED Triage Notes (Signed)
Pt reports upper abd pain   [facility] and was sent to the er for eval of abd pain.  Blood pressure also elevated.  No n/v/  Pt alert  Speech clear.

## 2018-03-06 NOTE — Discharge Instructions (Addendum)
Please seek medical attention for any high fevers, chest pain, shortness of breath, change in behavior, persistent vomiting, bloody stool or any other new or concerning symptoms.  

## 2018-03-06 NOTE — ED Provider Notes (Signed)
[facility] Emergency Department Provider Note   ____________________________________________   I have reviewed the triage vital signs and the nursing notes.   HISTORY  Chief Complaint Abdominal Pain   History limited by: Not Limited   HPI Renee Lopez is a 77 y.o. female who presents to the emergency department today because of abdominal pain.  Located in the epigastric region.  It was somewhat burning in quality.  Patient stated that it started today.  By the time I examined had gone away.  She was right in the car when it started.  She did state that she had similar pain one day last week.  That was self-limiting.  She has had some associated nausea and bad taste in her mouth.  Family does state that they think she had some fried food before this happened.  Patient states she was told she had acid reflux in the past but is currently not on any medication.  She is status post cholecystectomy.  She denies any fevers, chest pain or shortness of breath.    Per medical record review patient has a history of cholecystectomy  Past Medical History:  Diagnosis Date  . Arthritis   . Hyperlipidemia   . Hypertension   . Seizures [facility](HCC)     Patient Active Problem List   Diagnosis Date Noted  . Advanced care planning/counseling discussion 11/23/2016  . Hypertension 05/06/2015  . Hyperlipidemia 05/06/2015  . Osteoarthritis 05/06/2015    Past Surgical History:  Procedure Laterality Date  . CHOLECYSTECTOMY      Prior to Admission medications   Medication Sig Start Date End Date Taking? Authorizing Provider  aspirin 81 MG tablet Take 81 mg by mouth daily.    [provider]  atorvastatin (LIPITOR) 10 MG tablet TAKE 1 TABLET (10 MG TOTAL) BY MOUTH DAILY AT 6 PM. 11/21/17   Steele Sizerrissman, Mark A, MD  levETIRAcetam (KEPPRA) 500 MG tablet Take 1 tablet (500 mg total) by mouth 2 (two) times daily. 05/19/16   Gabriel CirriWicker, Cheryl, NP  losartan (COZAAR) 100 MG  tablet TAKE 1 TABLET (100 MG TOTAL) BY MOUTH DAILY. 11/21/17   Steele Sizerrissman, Mark A, MD    Allergies Patient has no known allergies.  Family History  Problem Relation Age of Onset  . Seizures Mother   . Hyperlipidemia Mother   . Hypertension Mother   . Cancer Father        colon  . Hypertension Father     Social History Social History   Tobacco Use  . Smoking status: Never Smoker  . Smokeless tobacco: Never Used  Substance Use Topics  . Alcohol use: No    Alcohol/week: 0.0 oz  . Drug use: No    Review of Systems Constitutional: No fever/chills Eyes: No visual changes. ENT: No sore throat. Cardiovascular: Denies chest pain. Respiratory: Denies shortness of breath. Gastrointestinal: Positive for abdominal pain now resolved Genitourinary: Negative for dysuria. Musculoskeletal: Negative for back pain. Skin: Negative for rash. Neurological: Negative for headaches, focal weakness or numbness.  ____________________________________________   PHYSICAL EXAM:  VITAL SIGNS: ED Triage Vitals  Enc Vitals Group     BP 03/06/18 1706 (!) 210/91     Pulse Rate 03/06/18 1706 63     Resp 03/06/18 1706 18     Temp 03/06/18 1706 98.4 F (36.9 C)     Temp Source 03/06/18 1706 Oral     SpO2 03/06/18 1706 97 %     Weight 03/06/18 1706 138 lb (62.6  kg)     Height 03/06/18 1706 5\' 2"  (1.575 m)     Head Circumference --      Peak Flow --      Pain Score 03/06/18 1712 0    Constitutional: Alert and oriented.  Eyes: Conjunctivae are normal.  ENT      Head: Normocephalic and atraumatic.      Nose: No congestion/rhinnorhea.      Mouth/Throat: Mucous membranes are moist.      Neck: No stridor. Hematological/Lymphatic/Immunilogical: No cervical lymphadenopathy. Cardiovascular: Normal rate, regular rhythm.  No murmurs, rubs, or gallops.  Respiratory: Normal respiratory effort without tachypnea nor retractions. Breath sounds are clear and equal bilaterally. No  wheezes/rales/rhonchi. Gastrointestinal: Soft and non tender. No rebound. No guarding.  Genitourinary: Deferred Musculoskeletal: Normal range of motion in all extremities. No lower extremity edema. Neurologic:  Normal speech and language. No gross focal neurologic deficits are appreciated.  Skin:  Skin is warm, dry and intact. No rash noted. Psychiatric: Mood and affect are normal. Speech and behavior are normal. Patient exhibits appropriate insight and judgment.  ____________________________________________    LABS (pertinent positives/negatives)  Trop <0.03 Lipase 26 CBC wbc 5.7, hgb 12.8, plt 255, CMP wnl except glu 106, ast 44  ____________________________________________   EKG  I, Phineas Semen, attending physician, personally viewed and interpreted this EKG  EKG Time: 1721 Rate: 61 Rhythm: nsr Axis: lad Intervals: qtc 416 QRS: lvh ST changes: no st elevation Impression: abnormal ekg   ____________________________________________    RADIOLOGY  None   ____________________________________________   PROCEDURES  Procedures  ____________________________________________   INITIAL IMPRESSION / ASSESSMENT AND PLAN / ED COURSE  Pertinent labs & imaging results that were available during my care of the patient were reviewed by me and considered in my medical decision making (see chart for details).   Patient presented to the emergency department today because of concerns for abdominal pain.  By the time my exam the pain had resolved.  Patient's blood work without any concerning leukocytosis.  At this point I doubt significant intra-abdominal infection.  Do wonder if gastritis is likely..  Discussed this with patient and family.  Will plan on giving antiacid and sulfate.   ____________________________________________   FINAL CLINICAL IMPRESSION(S) / ED DIAGNOSES  Final diagnoses:  Epigastric pain     Note: This dictation was prepared with Dragon  dictation. Any transcriptional errors that result from this process are unintentional     Phineas Semen, MD 03/06/18 2251

## 2018-03-06 NOTE — ED Notes (Signed)
Unable to void at this time.

## 2018-03-22 ENCOUNTER — Other Ambulatory Visit: Payer: Self-pay | Admitting: Family Medicine

## 2018-05-26 ENCOUNTER — Ambulatory Visit (INDEPENDENT_AMBULATORY_CARE_PROVIDER_SITE_OTHER): Payer: Medicare PPO

## 2018-05-26 VITALS — BP 154/82 | HR 60 | Temp 97.6°F | Resp 16 | Ht 61.0 in | Wt 139.4 lb

## 2018-05-26 DIAGNOSIS — Z0000 Encounter for general adult medical examination without abnormal findings: Secondary | ICD-10-CM | POA: Diagnosis not present

## 2018-05-26 MED ORDER — FAMOTIDINE 40 MG PO TABS: 40.0000 mg | ORAL_TABLET | Freq: Every evening | ORAL | 6 refills | Status: DC

## 2018-05-26 NOTE — Patient Instructions (Addendum)
Renee Lopez , Thank you for taking time to come for your Medicare Wellness Visit. I appreciate your ongoing commitment to your health goals. Please review the following plan we discussed and let me know if I can assist you in the future.   Screening recommendations/referrals: Colonoscopy: no longer required Mammogram: no longer required Bone Density: declined Recommended yearly ophthalmology/optometry visit for glaucoma screening and checkup Recommended yearly dental visit for hygiene and checkup  Vaccinations: Influenza vaccine: due now- declined  Pneumococcal vaccine: completed series Tetanus/TD/Tdap vaccine: completed 01/07/2014 Shingles vaccine: shingrix eligible, check with your insurance company for coverage   Advanced directives: Please bring a copy of your health care power of attorney and living will to the office at your convenience.  Conditions/risks identified: Recommend drinking at least 6-8 glasses of water a day   Next appointment: Follow up in one year for your annual wellness exam.    Preventive Care 79 Years and Older, Female Preventive care refers to lifestyle choices and visits with your health care provider that can promote health and wellness. What does preventive care include?  A yearly physical exam. This is also called an annual well check.  Dental exams once or twice a year.  Routine eye exams. Ask your health care provider how often you should have your eyes checked.  Personal lifestyle choices, including:  Daily care of your teeth and gums.  Regular physical activity.  Eating a healthy diet.  Avoiding tobacco and drug use.  Limiting alcohol use.  Practicing safe sex.  Taking low-dose aspirin every day.  Taking vitamin and mineral supplements as recommended by your health care provider. What happens during an annual well check? The services and screenings done by your health care provider during your annual well check will depend on your  age, overall health, lifestyle risk factors, and family history of disease. Counseling  Your health care provider may ask you questions about your:  Alcohol use.  Tobacco use.  Drug use.  Emotional well-being.  Home and relationship well-being.  Sexual activity.  Eating habits.  History of falls.  Memory and ability to understand (cognition).  Work and work Astronomer.  Reproductive health. Screening  You may have the following tests or measurements:  Height, weight, and BMI.  Blood pressure.  Lipid and cholesterol levels. These may be checked every 5 years, or more frequently if you are over 79 years old.  Skin check.  Lung cancer screening. You may have this screening every year starting at age 79 if you have a 30-pack-year history of smoking and currently smoke or have quit within the past 15 years.  Fecal occult blood test (FOBT) of the stool. You may have this test every year starting at age 79.  Flexible sigmoidoscopy or colonoscopy. You may have a sigmoidoscopy every 5 years or a colonoscopy every 10 years starting at age 79.  Hepatitis C blood test.  Hepatitis B blood test.  Sexually transmitted disease (STD) testing.  Diabetes screening. This is done by checking your blood sugar (glucose) after you have not eaten for a while (fasting). You may have this done every 1-3 years.  Bone density scan. This is done to screen for osteoporosis. You may have this done starting at age 79.  Mammogram. This may be done every 1-2 years. Talk to your health care provider about how often you should have regular mammograms. Talk with your health care provider about your test results, treatment options, and if necessary, the need for more tests.  Vaccines  Your health care provider may recommend certain vaccines, such as:  Influenza vaccine. This is recommended every year.  Tetanus, diphtheria, and acellular pertussis (Tdap, Td) vaccine. You may need a Td booster  every 10 years.  Zoster vaccine. You may need this after age 79.  Pneumococcal 13-valent conjugate (PCV13) vaccine. One dose is recommended after age 79.  Pneumococcal polysaccharide (PPSV23) vaccine. One dose is recommended after age 79. Talk to your health care provider about which screenings and vaccines you need and how often you need them. This information is not intended to replace advice given to you by your health care provider. Make sure you discuss any questions you have with your health care provider. Document Released: 09/12/2015 Document Revised: 05/05/2016 Document Reviewed: 06/17/2015 Elsevier Interactive Patient Education  2017 [place] Prevention in the Home Falls can cause injuries. They can happen to people of all ages. There are many things you can do to make your home safe and to help prevent falls. What can I do on the outside of my home?  Regularly fix the edges of walkways and driveways and fix any cracks.  Remove anything that might make you trip as you walk through a door, such as a raised step or threshold.  Trim any bushes or trees on the path to your home.  Use bright outdoor lighting.  Clear any walking paths of anything that might make someone trip, such as rocks or tools.  Regularly check to see if handrails are loose or broken. Make sure that both sides of any steps have handrails.  Any raised decks and porches should have guardrails on the edges.  Have any leaves, snow, or ice cleared regularly.  Use sand or salt on walking paths during winter.  Clean up any spills in your garage right away. This includes oil or grease spills. What can I do in the bathroom?  Use night lights.  Install grab bars by the toilet and in the tub and shower. Do not use towel bars as grab bars.  Use non-skid mats or decals in the tub or shower.  If you need to sit down in the shower, use a plastic, non-slip stool.  Keep the floor dry. Clean up any  water that spills on the floor as soon as it happens.  Remove soap buildup in the tub or shower regularly.  Attach bath mats securely with double-sided non-slip rug tape.  Do not have throw rugs and other things on the floor that can make you trip. What can I do in the bedroom?  Use night lights.  Make sure that you have a light by your bed that is easy to reach.  Do not use any sheets or blankets that are too big for your bed. They should not hang down onto the floor.  Have a firm chair that has side arms. You can use this for support while you get dressed.  Do not have throw rugs and other things on the floor that can make you trip. What can I do in the kitchen?  Clean up any spills right away.  Avoid walking on wet floors.  Keep items that you use a lot in easy-to-reach places.  If you need to reach something above you, use a strong step stool that has a grab bar.  Keep electrical cords out of the way.  Do not use floor polish or wax that makes floors slippery. If you must use wax, use non-skid floor wax.  Do not have throw rugs and other things on the floor that can make you trip. What can I do with my stairs?  Do not leave any items on the stairs.  Make sure that there are handrails on both sides of the stairs and use them. Fix handrails that are broken or loose. Make sure that handrails are as long as the stairways.  Check any carpeting to make sure that it is firmly attached to the stairs. Fix any carpet that is loose or worn.  Avoid having throw rugs at the top or bottom of the stairs. If you do have throw rugs, attach them to the floor with carpet tape.  Make sure that you have a light switch at the top of the stairs and the bottom of the stairs. If you do not have them, ask someone to add them for you. What else can I do to help prevent falls?  Wear shoes that:  Do not have high heels.  Have rubber bottoms.  Are comfortable and fit you well.  Are closed  at the toe. Do not wear sandals.  If you use a stepladder:  Make sure that it is fully opened. Do not climb a closed stepladder.  Make sure that both sides of the stepladder are locked into place.  Ask someone to hold it for you, if possible.  Clearly mark and make sure that you can see:  Any grab bars or handrails.  First and last steps.  Where the edge of each step is.  Use tools that help you move around (mobility aids) if they are needed. These include:  Canes.  Walkers.  Scooters.  Crutches.  Turn on the lights when you go into a dark area. Replace any light bulbs as soon as they burn out.  Set up your furniture so you have a clear path. Avoid moving your furniture around.  If any of your floors are uneven, fix them.  If there are any pets around you, be aware of where they are.  Review your medicines with your doctor. Some medicines can make you feel dizzy. This can increase your chance of falling. Ask your doctor what other things that you can do to help prevent falls. This information is not intended to replace advice given to you by your health care provider. Make sure you discuss any questions you have with your health care provider. Document Released: 06/12/2009 Document Revised: 01/22/2016 Document Reviewed: 09/20/2014 Elsevier Interactive Patient Education  2017 [employer].

## 2018-05-26 NOTE — Progress Notes (Signed)
Subjective:   Renee Lopez is a 77 y.o. female who presents for Medicare Annual (Subsequent) preventive examination.  Review of Systems:  Cardiac Risk Factors include: hypertension;advanced age (>79men, >68 women);dyslipidemia     Objective:     Vitals: BP (!) 154/82 (BP Location: Left Arm, Patient Position: Sitting)   Pulse 60   Temp 97.6 F (36.4 C) (Temporal)   Resp 16   Ht 5\' 1"  (1.549 m)   Wt 139 lb 6.4 oz (63.2 kg)   LMP  (LMP Unknown)   SpO2 95%   BMI 26.34 kg/m   Body mass index is 26.34 kg/m.  Advanced Directives 05/26/2018 05/04/2017 05/19/2016  Does Patient Have a Medical Advance Directive? Yes Yes Yes  Type of Advance Directive Living will;Healthcare Power of [employer] Power of [place];Living will Healthcare Power of [place];Living will  Copy of Healthcare Power of Attorney in Chart? No - copy requested No - copy requested -    Tobacco Social History   Tobacco Use  Smoking Status Never Smoker  Smokeless Tobacco Never Used     Counseling given: Not Answered   Clinical Intake:  Pre-visit preparation completed: Yes  Pain : No/denies pain     Nutritional Status: BMI 25 -29 Overweight Nutritional Risks: None Diabetes: No  How often do you need to have someone help you when you read instructions, pamphlets, or other written materials from your doctor or pharmacy?: 1 - Never What is the last grade level you completed in school?: high school   Interpreter Needed?: No  Information entered by :: Tiffany Hill,LPN   Past Medical History:  Diagnosis Date  . Arthritis   . Hyperlipidemia   . Hypertension   . Seizures (HCC)    Past Surgical History:  Procedure Laterality Date  . CHOLECYSTECTOMY     Family History  Problem Relation Age of Onset  . Seizures Mother   . Hyperlipidemia Mother   . Hypertension Mother   . Cancer Father        colon  . Hypertension Father    Social History   Socioeconomic History  . Marital  status: Married    Spouse name: Not on file  . Number of children: Not on file  . Years of education: Not on file  . Highest education level: High school graduate  Occupational History  . Not on file  Social Needs  . Financial resource strain: Not hard at all  . Food insecurity:    Worry: Never true    Inability: Never true  . Transportation needs:    Medical: No    Non-medical: No  Tobacco Use  . Smoking status: Never Smoker  . Smokeless tobacco: Never Used  Substance and Sexual Activity  . Alcohol use: No    Alcohol/week: 0.0 standard drinks  . Drug use: No  . Sexual activity: Yes  Lifestyle  . Physical activity:    Days per week: 0 days    Minutes per session: 0 min  . Stress: Not at all  Relationships  . Social connections:    Talks on phone: More than three times a week    Gets together: More than three times a week    Attends religious service: More than 4 times per year    Active member of club or organization: No    Attends meetings of clubs or organizations: Never    Relationship status: Married  Other Topics Concern  . Not on file  Social History Narrative  .  Not on file    Outpatient Encounter Medications as of 05/26/2018  Medication Sig  . aspirin 81 MG tablet Take 81 mg by mouth daily.  Marland Kitchen atorvastatin (LIPITOR) 10 MG tablet TAKE 1 TABLET (10 MG TOTAL) BY MOUTH DAILY AT 6 PM.  . famotidine (PEPCID) 40 MG tablet Take 1 tablet (40 mg total) by mouth every evening.  . levETIRAcetam (KEPPRA) 500 MG tablet Take 1 tablet (500 mg total) by mouth 2 (two) times daily.  Marland Kitchen losartan (COZAAR) 100 MG tablet TAKE 1 TABLET BY MOUTH EVERY DAY  . sucralfate (CARAFATE) 1 g tablet Take 1 tablet (1 g total) by mouth 4 (four) times daily.  . [DISCONTINUED] famotidine (PEPCID) 40 MG tablet Take 1 tablet (40 mg total) by mouth every evening.   No facility-administered encounter medications on file as of 05/26/2018.     Activities of Daily Living In your present state of  health, do you have any difficulty performing the following activities: 05/26/2018  Hearing? N  Vision? N  Difficulty concentrating or making decisions? N  Walking or climbing stairs? N  Dressing or bathing? N  Doing errands, shopping? N  Preparing Food and eating ? N  Using the Toilet? N  In the past six months, have you accidently leaked urine? N  Do you have problems with loss of bowel control? N  Managing your Medications? N  Managing your Finances? N  Housekeeping or managing your Housekeeping? N  Some recent data might be hidden    Patient Care Team: Gabriel Cirri, NP as PCP - General (Nurse Practitioner)    Assessment:   This is a routine wellness examination for Renee Lopez.  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 DIET - INCREASE WATER INTAKE     Recommend drinking at least 6-8 glasses of water a day        Fall Risk Fall Risk  05/26/2018 05/04/2017 05/19/2016  Falls in the past year? No No No   Is the patient's home free of loose throw rugs in walkways, pet beds, electrical cords, etc?   yes      Grab bars in the bathroom? no      Handrails on the stairs?   yes      Adequate lighting?   yes  Timed Get Up and Go performed: Completed in 8 seconds with no use of assistive devices, steady gait. No intervention needed at this time.   Depression Screen PHQ 2/9 Scores 05/26/2018 05/04/2017 05/19/2016  PHQ - 2 Score 0 0 0  PHQ- 9 Score - - 0     Cognitive Function     6CIT Screen 05/26/2018 05/04/2017  What Year? 0 points 0 points  What month? 0 points 0 points  What time? 0 points 0 points  Count back from 20 0 points 0 points  Months in reverse 0 points 0 points  Repeat phrase 2 points 2 points  Total Score 2 2    Immunization History  Administered Date(s) Administered  .  Influenza, High Dose Seasonal PF 06/24/2017  . Influenza-Unspecified 06/09/2015, 06/10/2016, 06/21/2017  . Pneumococcal Conjugate-13 05/10/2014  . Pneumococcal Polysaccharide-23 05/19/2016  . Td 01/07/2014    Influenza: due now- patient [facility]  Tetanus: TD completed 01/07/2014 Pneumococcal: completed series  Qualifies for Shingles Vaccine?  Yes discussed shingrix vaccine  Screening Tests Health Maintenance  Topic Date Due  . INFLUENZA VACCINE  03/30/2018  . TETANUS/TDAP  01/08/2024  . PNA vac Low Risk Adult  Completed  . DEXA SCAN  Discontinued    Cancer Screenings: Lung: Low Dose CT Chest recommended if Age 9-80 years, 30 pack-year currently smoking OR have quit w/in 15years. Patient does not qualify. Breast:  Up to date on Mammogram? Yes  No longer required Up to date of Bone Density/Dexa? No declined Colorectal: no longer required  Additional Screenings:  Hepatitis C Screening: not indicated      Plan:    I have personally reviewed and addressed the Medicare Annual Wellness questionnaire and have noted the following in the patient's chart:  A. Medical and social history B. Use of alcohol, tobacco or illicit drugs  C. Current medications and supplements D. Functional ability and status E.  Nutritional status F.  Physical activity G. Advance directives H. List of other physicians I.  Hospitalizations, surgeries, and ER visits in previous 12 months J.  Vitals K. Screenings such as hearing and vision if needed, cognitive and depression L. Referrals and appointments   In addition, I have reviewed and discussed with patient certain preventive protocols, quality metrics, and best practice recommendations. A written personalized care plan for preventive services as well as general preventive health recommendations were provided to patient.   Signed,  Marin Roberts, LPN Nurse Health Advisor   Nurse Notes:needs refill on pepcid - verbal order from  Margit Hanks for Pepcid 40mg - 30 tabs with 6 refills. Sent rx to preferred pharmacy.

## 2018-06-02 ENCOUNTER — Encounter: Payer: Self-pay | Admitting: Unknown Physician Specialty

## 2018-06-02 ENCOUNTER — Ambulatory Visit (INDEPENDENT_AMBULATORY_CARE_PROVIDER_SITE_OTHER): Payer: Medicare PPO | Admitting: Unknown Physician Specialty

## 2018-06-02 VITALS — BP 156/81 | HR 64 | Temp 98.0°F | Ht 61.5 in | Wt 140.8 lb

## 2018-06-02 DIAGNOSIS — Z0000 Encounter for general adult medical examination without abnormal findings: Secondary | ICD-10-CM

## 2018-06-02 DIAGNOSIS — Z7189 Other specified counseling: Secondary | ICD-10-CM | POA: Diagnosis not present

## 2018-06-02 DIAGNOSIS — I10 Essential (primary) hypertension: Secondary | ICD-10-CM

## 2018-06-02 DIAGNOSIS — E7800 Pure hypercholesterolemia, unspecified: Secondary | ICD-10-CM

## 2018-06-02 NOTE — Assessment & Plan Note (Signed)
Check lipid panel today 

## 2018-06-02 NOTE — Progress Notes (Signed)
BP (!) 156/81 (BP Location: Left Arm, Patient Position: Sitting, Cuff Size: Normal)   Pulse 64   Temp 98 F (36.7 C) (Oral)   Ht 5' 1.5" (1.562 m)   Wt 140 lb 12.8 oz (63.9 kg)   LMP  (LMP Unknown)   SpO2 97%   BMI 26.17 kg/m    Subjective:    Patient ID: Renee Lopez, female    DOB: [birth year], 77 y.o.   MRN: [id number]  HPI: Renee Lopez is a 77 y.o. female  Chief Complaint  Patient presents with  . Annual Exam  . Arthritis    Patient complaining of her arthritis   Hypertension Known white coat syndrome Average home SBP 120's  No problems or lightheadedness No chest pain with exertion or shortness of breath No Edema  Hyperlipidemia Using medications without problems: No Muscle aches  Diet compliance:Exercise:Doing well   Social History   Socioeconomic History  . Marital status: Married    Spouse name: Not on file  . Number of children: Not on file  . Years of education: Not on file  . Highest education level: High school graduate  Occupational History  . Not on file  Social Needs  . Financial resource strain: Not hard at all  . Food insecurity:    Worry: Never true    Inability: Never true  . Transportation needs:    Medical: No    Non-medical: No  Tobacco Use  . Smoking status: Never Smoker  . Smokeless tobacco: Never Used  Substance and Sexual Activity  . Alcohol use: No    Alcohol/week: 0.0 standard drinks  . Drug use: No  . Sexual activity: Yes  Lifestyle  . Physical activity:    Days per week: 0 days    Minutes per session: 0 min  . Stress: Not at all  Relationships  . Social connections:    Talks on phone: More than three times a week    Gets together: More than three times a week    Attends religious service: More than 4 times per year    Active member of club or organization: No    Attends meetings of clubs or organizations: Never    Relationship status: Married  . Intimate partner violence:    Fear of current or  ex partner: No    Emotionally abused: No    Physically abused: No    Forced sexual activity: No  Other Topics Concern  . Not on file  Social History Narrative  . Not on file   Family History  Problem Relation Age of Onset  . Seizures Mother   . Hyperlipidemia Mother   . Hypertension Mother   . Cancer Father        colon  . Hypertension Father    Past Medical History:  Diagnosis Date  . Arthritis   . Hyperlipidemia   . Hypertension   . Seizures (HCC)      Relevant past medical, surgical, family and social history reviewed and updated as indicated. Interim medical history since our last visit reviewed. Allergies and medications reviewed and updated.  Review of Systems  Constitutional: Negative.   HENT: Negative.   Eyes: Negative.   Respiratory: Negative.   Cardiovascular: Negative.   Gastrointestinal:       Acid reflex relieved by Pepsid  Musculoskeletal: Negative.   Skin: Negative.   Psychiatric/Behavioral: Negative.     Per HPI unless specifically indicated above     Objective:  BP (!) 156/81 (BP Location: Left Arm, Patient Position: Sitting, Cuff Size: Normal)   Pulse 64   Temp 98 F (36.7 C) (Oral)   Ht 5' 1.5" (1.562 m)   Wt 140 lb 12.8 oz (63.9 kg)   LMP  (LMP Unknown)   SpO2 97%   BMI 26.17 kg/m   Wt Readings from Last 3 Encounters:  06/02/18 140 lb 12.8 oz (63.9 kg)  05/26/18 139 lb 6.4 oz (63.2 kg)  03/06/18 138 lb (62.6 kg)    Physical Exam  Constitutional: She is oriented to person, place, and time. She appears well-developed and well-nourished. No distress.  HENT:  Head: Normocephalic and atraumatic.  Eyes: Conjunctivae and lids are normal. Right eye exhibits no discharge. Left eye exhibits no discharge. No scleral icterus.  Neck: Normal range of motion. Neck supple. No JVD present. Carotid bruit is not present.  Cardiovascular: Normal rate, regular rhythm and normal heart sounds.  Pulmonary/Chest: Effort normal and breath sounds  normal.  Abdominal: Normal appearance. There is no splenomegaly or hepatomegaly.  Refusing.    Musculoskeletal: Normal range of motion.  Neurological: She is alert and oriented to person, place, and time.  Skin: Skin is warm, dry and intact. No rash noted. No pallor.  Psychiatric: She has a normal mood and affect. Her behavior is normal. Judgment and thought content normal.   Refusing breast exam  Results for orders placed or performed during the hospital encounter of 03/06/18  Lipase, blood  Result Value Ref Range   Lipase 26 11 - 51 U/L  Comprehensive metabolic panel  Result Value Ref Range   Sodium 143 135 - 145 mmol/L   Potassium 3.8 3.5 - 5.1 mmol/L   Chloride 108 98 - 111 mmol/L   CO2 28 22 - 32 mmol/L   Glucose, Bld 106 (H) 70 - 99 mg/dL   BUN 14 8 - 23 mg/dL   Creatinine, Ser 1.61 0.44 - 1.00 mg/dL   Calcium 9.0 8.9 - 09.6 mg/dL   Total Protein 7.1 6.5 - 8.1 g/dL   Albumin 3.9 3.5 - 5.0 g/dL   AST 44 (H) 15 - 41 U/L   ALT 18 0 - 44 U/L   Alkaline Phosphatase 103 38 - 126 U/L   Total Bilirubin 0.7 0.3 - 1.2 mg/dL   GFR calc non Af Amer >60 >60 mL/min   GFR calc Af Amer >60 >60 mL/min   Anion gap 7 5 - 15  CBC  Result Value Ref Range   WBC 5.7 3.6 - 11.0 K/uL   RBC 4.17 3.80 - 5.20 MIL/uL   Hemoglobin 12.8 12.0 - 16.0 g/dL   HCT 04.5 40.9 - 81.1 %   MCV 91.4 80.0 - 100.0 fL   MCH 30.8 26.0 - 34.0 pg   MCHC 33.6 32.0 - 36.0 g/dL   RDW 91.4 78.2 - 95.6 %   Platelets 255 150 - 440 K/uL  Urinalysis, Complete w Microscopic  Result Value Ref Range   Color, Urine YELLOW (A) YELLOW   APPearance CLEAR (A) CLEAR   Specific Gravity, Urine 1.011 1.005 - 1.030   pH 6.0 5.0 - 8.0   Glucose, UA NEGATIVE NEGATIVE mg/dL   Hgb urine dipstick MODERATE (A) NEGATIVE   Bilirubin Urine NEGATIVE NEGATIVE   Ketones, ur 5 (A) NEGATIVE mg/dL   Protein, ur NEGATIVE NEGATIVE mg/dL   Nitrite NEGATIVE NEGATIVE   Leukocytes, UA NEGATIVE NEGATIVE   RBC / HPF 0-5 0 - 5 RBC/hpf   WBC,  UA  0-5 0 - 5 WBC/hpf   Bacteria, UA NONE SEEN NONE SEEN   Squamous Epithelial / LPF 0-5 0 - 5   Mucus PRESENT   Troponin I  Result Value Ref Range   Troponin I <0.03 <0.03 ng/mL      Assessment & Plan:   Problem List Items Addressed This Visit      Unprioritized   Advanced care planning/counseling discussion    A voluntary discussion about advance care planning including the explanation and discussion of advance directives was extensively discussed  with the patient.  Explanation about the health care proxy and Living will was reviewed and packet with forms with explanation of how to fill them out was given.  During this discussion, the patient was able to identify a health care proxy as her daughter.  Filled out forms but has not notarized them.   Patient was offered a separate Advance Care Planning visit for further assistance with forms.  She does not want to be a DNR but does not want to linger in the hospital.  She is OK with home care assistance.  Time spent: 20 minutes        Individuals present: Daughter and patient       Hyperlipidemia    Check lipid panel today      Relevant Orders   Lipid Panel w/o Chol/HDL Ratio   Hypertension    White coat.  Check labs       Relevant Orders   Comprehensive metabolic panel    Other Visit Diagnoses    Annual physical exam    -  Primary      Health maint: Refusing mammogram Refusing Shingrx Planning on getting flu shot  Follow up plan: Return in about 6 months (around 12/02/2018).

## 2018-06-02 NOTE — Assessment & Plan Note (Addendum)
White coat.  Check labs

## 2018-06-02 NOTE — Assessment & Plan Note (Addendum)
A voluntary discussion about advance care planning including the explanation and discussion of advance directives was extensively discussed  with the patient.  Explanation about the health care proxy and Living will was reviewed and packet with forms with explanation of how to fill them out was given.  During this discussion, the patient was able to identify a health care proxy as her daughter.  Filled out forms but has not notarized them.   Patient was offered a separate Advance Care Planning visit for further assistance with forms.  She does not want to be a DNR but does not want to linger in the hospital.  She is OK with home care assistance.  Time spent: 20 minutes        Individuals present: Daughter and patient

## 2018-06-03 LAB — COMPREHENSIVE METABOLIC PANEL
A/G RATIO: 1.7 (ref 1.2–2.2)
ALT: 51 IU/L — AB (ref 0–32)
AST: 84 IU/L — AB (ref 0–40)
Albumin: 4 g/dL (ref 3.5–4.8)
Alkaline Phosphatase: 141 IU/L — ABNORMAL HIGH (ref 39–117)
BUN/Creatinine Ratio: 19 (ref 12–28)
BUN: 16 mg/dL (ref 8–27)
Bilirubin Total: 0.8 mg/dL (ref 0.0–1.2)
CALCIUM: 9.4 mg/dL (ref 8.7–10.3)
CO2: 22 mmol/L (ref 20–29)
Chloride: 107 mmol/L — ABNORMAL HIGH (ref 96–106)
Creatinine, Ser: 0.86 mg/dL (ref 0.57–1.00)
GFR calc Af Amer: 74 mL/min/{1.73_m2} (ref >59)
GFR, EST NON AFRICAN AMERICAN: 64 mL/min/{1.73_m2} (ref >59)
Globulin, Total: 2.4 g/dL (ref 1.5–4.5)
Glucose: 103 mg/dL — ABNORMAL HIGH (ref 65–99)
POTASSIUM: 4 mmol/L (ref 3.5–5.2)
Sodium: 147 mmol/L — ABNORMAL HIGH (ref 134–144)
Total Protein: 6.4 g/dL (ref 6.0–8.5)

## 2018-06-03 LAB — LIPID PANEL W/O CHOL/HDL RATIO
CHOLESTEROL TOTAL: 125 mg/dL (ref 100–199)
HDL: 31 mg/dL — AB (ref >39)
LDL Calculated: 80 mg/dL (ref 0–99)
TRIGLYCERIDES: 69 mg/dL (ref 0–149)
VLDL Cholesterol Cal: 14 mg/dL (ref 5–40)

## 2018-06-05 ENCOUNTER — Telehealth: Payer: Self-pay | Admitting: Unknown Physician Specialty

## 2018-06-05 NOTE — Telephone Encounter (Signed)
Spoke with patient's daughter, let her know the levels of the recent lab work.

## 2018-06-05 NOTE — Telephone Encounter (Signed)
Copied from CRM [id number]. Topic: Quick Communication - See Telephone Encounter >> Jun 05, 2018 12:34 PM Lorayne Bender wrote: CRM for notification. See Telephone encounter for: 06/05/18.  Pt's daughter, Andrey Campanile, calling in.  States pt is upset because she doesn't understand some results that she just received.  Andrey Campanile is wanting to speak with someone so that she can understand what is going on. Andrey Campanile can be reached at [phone] or [phone].

## 2018-06-30 ENCOUNTER — Encounter: Payer: Self-pay | Admitting: Unknown Physician Specialty

## 2018-06-30 ENCOUNTER — Ambulatory Visit (INDEPENDENT_AMBULATORY_CARE_PROVIDER_SITE_OTHER): Payer: 59 | Admitting: Unknown Physician Specialty

## 2018-06-30 VITALS — BP 188/92 | HR 61 | Wt 140.0 lb

## 2018-06-30 DIAGNOSIS — R7401 Elevation of levels of liver transaminase levels: Secondary | ICD-10-CM

## 2018-06-30 DIAGNOSIS — R740 Nonspecific elevation of levels of transaminase and lactic acid dehydrogenase [LDH]: Secondary | ICD-10-CM | POA: Diagnosis not present

## 2018-06-30 DIAGNOSIS — R748 Abnormal levels of other serum enzymes: Secondary | ICD-10-CM

## 2018-06-30 NOTE — Progress Notes (Signed)
BP (!) 188/92   Pulse 61   Wt 140 lb (63.5 kg)   LMP  (LMP Unknown)   SpO2 98%   BMI 26.02 kg/m    Subjective:    Patient ID: Renee Lopez, female    DOB: [birth year], 77 y.o.   MRN: [id number]  HPI: Renee Lopez is a 77 y.o. female  Chief Complaint  Patient presents with  . Follow-up    1 month f/u elevated liver enzymes   Pt is here to f/u elevated SGOT, SGPT, and Alk Phos.  No complaints besides history of GERD and takes Pepsid.  This has been ongoing for a period of time and not getting worse  Relevant past medical, surgical, family and social history reviewed and updated as indicated. Interim medical history since our last visit reviewed. Allergies and medications reviewed and updated.  Review of Systems  Per HPI unless specifically indicated above     Objective:    BP (!) 188/92   Pulse 61   Wt 140 lb (63.5 kg)   LMP  (LMP Unknown)   SpO2 98%   BMI 26.02 kg/m   Wt Readings from Last 3 Encounters:  06/30/18 140 lb (63.5 kg)  06/02/18 140 lb 12.8 oz (63.9 kg)  05/26/18 139 lb 6.4 oz (63.2 kg)    Physical Exam  Constitutional: She is oriented to person, place, and time. She appears well-developed and well-nourished. No distress.  HENT:  Head: Normocephalic and atraumatic.  Eyes: Conjunctivae and lids are normal. Right eye exhibits no discharge. Left eye exhibits no discharge. No scleral icterus.  Neck: Normal range of motion. Neck supple. No JVD present. Carotid bruit is not present.  Cardiovascular: Normal rate, regular rhythm and normal heart sounds.  Abdominal: Soft. Normal appearance. She exhibits no distension and no mass. There is no hepatosplenomegaly, splenomegaly or hepatomegaly. There is no tenderness. There is no rebound and no guarding. No hernia.  Musculoskeletal: Normal range of motion.  Neurological: She is alert and oriented to person, place, and time.  Skin: Skin is warm, dry and intact. No rash noted. No pallor.  Psychiatric:  She has a normal mood and affect. Her behavior is normal. Judgment and thought content normal.    Results for orders placed or performed in visit on 06/02/18  Comprehensive metabolic panel  Result Value Ref Range   Glucose 103 (H) 65 - 99 mg/dL   BUN 16 8 - 27 mg/dL   Creatinine, Ser 0.86 0.57 - 1.00 mg/dL   GFR calc non Af Amer 64 >59 mL/min/1.73   GFR calc Af Amer 74 >59 mL/min/1.73   BUN/Creatinine Ratio 19 12 - 28   Sodium 147 (H) 134 - 144 mmol/L   Potassium 4.0 3.5 - 5.2 mmol/L   Chloride 107 (H) 96 - 106 mmol/L   CO2 22 20 - 29 mmol/L   Calcium 9.4 8.7 - 10.3 mg/dL   Total Protein 6.4 6.0 - 8.5 g/dL   Albumin 4.0 3.5 - 4.8 g/dL   Globulin, Total 2.4 1.5 - 4.5 g/dL   Albumin/Globulin Ratio 1.7 1.2 - 2.2   Bilirubin Total 0.8 0.0 - 1.2 mg/dL   Alkaline Phosphatase 141 (H) 39 - 117 IU/L   AST 84 (H) 0 - 40 IU/L   ALT 51 (H) 0 - 32 IU/L  Lipid Panel w/o Chol/HDL Ratio  Result Value Ref Range   Cholesterol, Total 125 100 - 199 mg/dL   Triglycerides 69 0 - 149 mg/dL  HDL 31 (L) >39 mg/dL   VLDL Cholesterol Cal 14 5 - 40 mg/dL   LDL Calculated 80 0 - 99 mg/dL      Assessment & Plan:   Problem List Items Addressed This Visit    None    Visit Diagnoses    Elevated alkaline phosphatase level    -  Primary   Recheck.  Add amylase and Lipase and GGT   Relevant Orders   Gamma GT   Amylase   Lipase   Levetiracetam level   CBC with Differential/Platelet   Elevated transaminase level       Recheck.  check Keppra levels.  Pt does not drink alcohol   Relevant Orders   Amylase   Lipase   Comprehensive metabolic panel   Levetiracetam level   CBC with Differential/Platelet       Follow up plan: Follow up with results

## 2018-07-02 LAB — LIPASE: Lipase: 14 U/L (ref 14–85)

## 2018-07-02 LAB — CBC WITH DIFFERENTIAL/PLATELET
Basophils Absolute: 0 10*3/uL (ref 0.0–0.2)
Basos: 1 %
EOS (ABSOLUTE): 0.2 10*3/uL (ref 0.0–0.4)
EOS: 4 %
HEMATOCRIT: 40.5 % (ref 34.0–46.6)
HEMOGLOBIN: 13.2 g/dL (ref 11.1–15.9)
Immature Grans (Abs): 0 10*3/uL (ref 0.0–0.1)
Immature Granulocytes: 0 %
LYMPHS: 27 %
Lymphocytes Absolute: 1 10*3/uL (ref 0.7–3.1)
MCH: 28.6 pg (ref 26.6–33.0)
MCHC: 32.6 g/dL (ref 31.5–35.7)
MCV: 88 fL (ref 79–97)
Monocytes Absolute: 0.4 10*3/uL (ref 0.1–0.9)
Monocytes: 11 %
NEUTROS ABS: 2.2 10*3/uL (ref 1.4–7.0)
Neutrophils: 57 %
Platelets: 245 10*3/uL (ref 150–450)
RBC: 4.62 x10E6/uL (ref 3.77–5.28)
RDW: 12.8 % (ref 12.3–15.4)
WBC: 3.8 10*3/uL (ref 3.4–10.8)

## 2018-07-02 LAB — COMPREHENSIVE METABOLIC PANEL
A/G RATIO: 1.8 (ref 1.2–2.2)
ALBUMIN: 4.2 g/dL (ref 3.5–4.8)
ALT: 12 IU/L (ref 0–32)
AST: 19 IU/L (ref 0–40)
Alkaline Phosphatase: 117 IU/L (ref 39–117)
BUN / CREAT RATIO: 12 (ref 12–28)
BUN: 12 mg/dL (ref 8–27)
Bilirubin Total: 0.9 mg/dL (ref 0.0–1.2)
CALCIUM: 9.4 mg/dL (ref 8.7–10.3)
CHLORIDE: 107 mmol/L — AB (ref 96–106)
CO2: 22 mmol/L (ref 20–29)
Creatinine, Ser: 0.98 mg/dL (ref 0.57–1.00)
GFR calc Af Amer: 63 mL/min/{1.73_m2} (ref >59)
GFR, EST NON AFRICAN AMERICAN: 55 mL/min/{1.73_m2} — AB (ref >59)
Globulin, Total: 2.4 g/dL (ref 1.5–4.5)
Glucose: 82 mg/dL (ref 65–99)
Potassium: 4.1 mmol/L (ref 3.5–5.2)
Sodium: 144 mmol/L (ref 134–144)
TOTAL PROTEIN: 6.6 g/dL (ref 6.0–8.5)

## 2018-07-02 LAB — GAMMA GT: GGT: 33 IU/L (ref 0–60)

## 2018-07-02 LAB — AMYLASE: Amylase: 89 U/L (ref 31–124)

## 2018-07-02 LAB — LEVETIRACETAM LEVEL: Levetiracetam Lvl: 13.1 ug/mL (ref 10.0–40.0)

## 2018-07-03 ENCOUNTER — Encounter: Payer: Self-pay | Admitting: Unknown Physician Specialty

## 2018-07-04 ENCOUNTER — Telehealth: Payer: Self-pay | Admitting: Unknown Physician Specialty

## 2018-07-04 NOTE — Telephone Encounter (Signed)
Pt's daughter called to request lab results.  Daughter, Andrey Campanile is on pt's DPR.  Informed of result note per Gabriel Cirri on 07/03/18.  Daughter verb. Understanding.   (documented on telephone encounter, as result note not made avail. to [facility] Triage nurse.)

## 2018-08-03 ENCOUNTER — Other Ambulatory Visit: Payer: Self-pay

## 2018-08-03 MED ORDER — ATORVASTATIN CALCIUM 10 MG PO TABS: 10.0000 mg | ORAL_TABLET | Freq: Every day | ORAL | 0 refills | Status: DC

## 2018-08-03 MED ORDER — LOSARTAN POTASSIUM 100 MG PO TABS: 100.0000 mg | ORAL_TABLET | Freq: Every day | ORAL | 0 refills | Status: DC

## 2018-09-23 ENCOUNTER — Telehealth: Payer: Self-pay | Admitting: Family Medicine

## 2018-09-25 NOTE — Telephone Encounter (Signed)
Please get patient scheduled.  °

## 2018-09-25 NOTE — Telephone Encounter (Signed)
LR 08/03/18 for 90 tabs. Should not be due before March 2020. Angiotensin receptor blockers failed. Requested Prescriptions  Refused Prescriptions Disp Refills  . losartan (COZAAR) 100 MG tablet [Pharmacy Med Name: LOSARTAN POTASSIUM 100 MG TAB] 90 tablet 0    Sig: TAKE 1 TABLET BY MOUTH EVERY DAY     Cardiovascular:  Angiotensin Receptor Blockers Failed - 09/25/2018 11:11 AM      Failed - Last BP in normal range    BP Readings from Last 1 Encounters:  06/30/18 (!) 188/92         Passed - Cr in normal range and within 180 days    Creatinine  Date Value Ref Range Status  01/28/2014 0.91 0.60 - 1.30 mg/dL Final   Creatinine, Ser  Date Value Ref Range Status  06/30/2018 0.98 0.57 - 1.00 mg/dL Final         Passed - K in normal range and within 180 days    Potassium  Date Value Ref Range Status  06/30/2018 4.1 3.5 - 5.2 mmol/L Final  01/28/2014 3.7 3.5 - 5.1 mmol/L Final         Passed - Patient is not pregnant      Passed - Valid encounter within last 6 months    Recent Outpatient Visits          2 months ago Elevated alkaline phosphatase level   [facility] Gabriel Cirri, NP   3 months ago Annual physical exam   [facility] Gabriel Cirri, NP   10 months ago Essential hypertension   [facility] Gabriel Cirri, NP   1 year ago Pure hypercholesterolemia   [facility] Gabriel Cirri, NP   1 year ago Primary osteoarthritis of left knee   [facility] Gabriel Cirri, NP      Future Appointments            In 2 months Cannady, Dorie Rank, NP [facility]   In 8 months  [facility]

## 2018-09-25 NOTE — Telephone Encounter (Signed)
Called pt to schedule appointment no answer no voicemail reached out to pt's daughter Gavin Pound who states she will have her call in to schedule appointment for refill.

## 2018-09-25 NOTE — Telephone Encounter (Signed)
Needs follow-up

## 2018-09-26 ENCOUNTER — Encounter: Payer: Self-pay | Admitting: Unknown Physician Specialty

## 2018-09-26 NOTE — Telephone Encounter (Signed)
Letter mailed to pt.  

## 2018-10-14 ENCOUNTER — Other Ambulatory Visit: Payer: Self-pay | Admitting: Family Medicine

## 2018-11-11 ENCOUNTER — Encounter: Payer: Self-pay | Admitting: Nurse Practitioner

## 2018-11-11 NOTE — Progress Notes (Signed)
Pt is out of losartan and med is on ba korser at her pharmacy.  Will send over valsartan 160 mg one tab po daily #30 tabs 0 refills.  Called pharmacy to submit verbal order. Pt can f/u with Elnita Maxwell at next convenience.

## 2018-11-13 ENCOUNTER — Other Ambulatory Visit: Payer: Self-pay | Admitting: Nurse Practitioner

## 2018-11-13 ENCOUNTER — Telehealth: Payer: Self-pay

## 2018-11-13 MED ORDER — TELMISARTAN 80 MG PO TABS: 80.0000 mg | ORAL_TABLET | Freq: Every day | ORAL | 3 refills | Status: DC

## 2018-11-13 NOTE — Telephone Encounter (Signed)
Have sent in Telmisartan as alternate while Losartan on back order.  Just ensure they are monitoring BP daily at home.  This medication is in same family as Losartan, but is a different medication.  Monitor for low BP readings and notify provider if present.

## 2018-11-13 NOTE — Telephone Encounter (Signed)
Left message on machine for pt to return call to the office.  

## 2018-11-13 NOTE — Telephone Encounter (Signed)
Daughter, Alyson Reedy, called and stated pt took last BP pill last night, her pharmacy is on back order for Cozaar, pharmacist suggested pt see if provider can send in alternative. Please advise.

## 2018-11-13 NOTE — Progress Notes (Signed)
Losartan on back order.  Will send in Telmisartan script for patient to cover until Losartan back in pharmacy.

## 2018-11-14 ENCOUNTER — Other Ambulatory Visit: Payer: Self-pay | Admitting: Nurse Practitioner

## 2018-11-16 NOTE — Telephone Encounter (Signed)
Left message on machine for pt to return call to the office if they had any questions regarding new RX.

## 2018-11-29 ENCOUNTER — Telehealth: Payer: Self-pay | Admitting: Unknown Physician Specialty

## 2018-11-29 NOTE — Telephone Encounter (Signed)
Copied from CRM [id number]. Topic: General - Other >> Nov 29, 2018 12:32 PM Tamela Oddi wrote: Reason for CRM: Patient called to follow up regarding her upcoming appt. On 12/04/18.  Patient is not sure if it needs to be rescheduled or if she needs a virtual appt.  Please advise and call patient to let her know.  CB# [phone]

## 2018-11-29 NOTE — Telephone Encounter (Signed)
Please advise if patient needs to be rescheduled for a virtual appointment...  Thank you

## 2018-11-29 NOTE — Telephone Encounter (Signed)
Spoke with the patient. Rescheduled for 5/11 @ 10:30. Sent reminder letter

## 2018-11-29 NOTE — Telephone Encounter (Signed)
We can reschedule and push the appointment out a little since her last labs were normal.

## 2018-12-04 ENCOUNTER — Ambulatory Visit: Payer: Self-pay | Admitting: Nurse Practitioner

## 2018-12-04 ENCOUNTER — Other Ambulatory Visit: Payer: Self-pay | Admitting: Nurse Practitioner

## 2018-12-07 ENCOUNTER — Telehealth: Payer: Self-pay | Admitting: Unknown Physician Specialty

## 2018-12-07 NOTE — Telephone Encounter (Signed)
Pt left a vm requesting a refill for valsartan due to her almost being out. This is not on pt's medication list. If someone could reach out to pt to clarify.

## 2018-12-08 ENCOUNTER — Other Ambulatory Visit: Payer: Self-pay | Admitting: Nurse Practitioner

## 2018-12-08 ENCOUNTER — Other Ambulatory Visit: Payer: Self-pay | Admitting: Unknown Physician Specialty

## 2018-12-08 MED ORDER — ATORVASTATIN CALCIUM 10 MG PO TABS: 10.0000 mg | ORAL_TABLET | Freq: Every day | ORAL | 0 refills | Status: DC

## 2018-12-08 NOTE — Telephone Encounter (Signed)
Patient notified

## 2018-12-08 NOTE — Telephone Encounter (Signed)
Tried calling patient, line continuously rang. Will try to call again Monday.

## 2018-12-08 NOTE — Telephone Encounter (Signed)
Attempted to notify pt. Line connected then immediately D/C. Attempted to reconnect w/ patient, line rang continuously.

## 2018-12-08 NOTE — Telephone Encounter (Signed)
Please let patient know her Losartan is on back order with pharmacy and we had to switch to Telmisartan.  There are refills available on this at pharmacy.  Once Losartan is back in, we can return to taking this.  Both medications do the same thing and are in the same family.  Have a great day.

## 2018-12-11 ENCOUNTER — Other Ambulatory Visit: Payer: Self-pay | Admitting: Nurse Practitioner

## 2018-12-18 ENCOUNTER — Other Ambulatory Visit: Payer: Self-pay | Admitting: Family Medicine

## 2018-12-26 ENCOUNTER — Other Ambulatory Visit: Payer: Self-pay

## 2018-12-26 MED ORDER — VALSARTAN 160 MG PO TABS: 160.0000 mg | ORAL_TABLET | Freq: Every day | ORAL | 0 refills | Status: DC

## 2018-12-26 NOTE — Telephone Encounter (Signed)
Patient last seen 06/30/18 and has appointment 12/2018.

## 2019-01-08 ENCOUNTER — Other Ambulatory Visit: Payer: Self-pay

## 2019-01-08 ENCOUNTER — Encounter: Payer: Self-pay | Admitting: Nurse Practitioner

## 2019-01-08 ENCOUNTER — Ambulatory Visit (INDEPENDENT_AMBULATORY_CARE_PROVIDER_SITE_OTHER): Payer: Medicare PPO | Admitting: Nurse Practitioner

## 2019-01-08 VITALS — BP 129/69 | HR 62 | Temp 97.4°F | Ht 62.0 in | Wt 138.0 lb

## 2019-01-08 DIAGNOSIS — R748 Abnormal levels of other serum enzymes: Secondary | ICD-10-CM

## 2019-01-08 DIAGNOSIS — G40109 Localization-related (focal) (partial) symptomatic epilepsy and epileptic syndromes with simple partial seizures, not intractable, without status epilepticus: Secondary | ICD-10-CM | POA: Diagnosis not present

## 2019-01-08 DIAGNOSIS — M170 Bilateral primary osteoarthritis of knee: Secondary | ICD-10-CM | POA: Diagnosis not present

## 2019-01-08 NOTE — Assessment & Plan Note (Signed)
Chronic, stable.  Recommend use of OTC Voltaren gel and continue daily gentle stretching and activity.  May also rotate between heat/ice for discomfort + wear knee braces.  Does not want further injections.

## 2019-01-08 NOTE — Patient Instructions (Signed)
Seizure, Adult °When you have a seizure: °· Parts of your body may move. °· You may have a change in how aware or awake (conscious) you are. °· You may shake (convulse). °Seizures usually last from 30 seconds to 2 minutes. Usually, they are not harmful unless they last a long time. °What are the signs or symptoms? °Common symptoms of this condition include: °· Shaking (convulsions). °· Stiffness in the body. °· Passing out (losing consciousness). °· Uncontrolled movements in the: °? Arms or legs. °? Eyes. °? Head. °? Mouth. °Some people have symptoms right before a seizure happens. These symptoms may include: °· Fear. °· Worry (anxiety). °· Feeling like you are going to throw up (nausea). °· Feeling like the room is spinning (vertigo). °· Feeling like you saw or heard something before (déjà vu). °· Odd tastes or smells. °· Changes in vision, such as seeing flashing lights or spots. °Follow these instructions at home: °Medicines ° °· Take over-the-counter and prescription medicines only as told by your doctor. °· Do not eat or drink anything that may keep your medicine from working, such as alcohol. °Activity °· Do not do any activities that would be dangerous if you had another seizure, like driving or swimming. Wait until your doctor says it is safe for you to do them. °· If you live in the [place] ask your local DMV (department of motor vehicles) when you can drive. °· Get plenty of rest. °Teaching others ° °· Teach friends and family what to do when you have a seizure. They should: °? Lay you on the ground. °? Protect your head and body. °? Loosen any tight clothing around your neck. °? Turn you on your side. °? Not hold you down. °? Not put anything into your mouth. °? Know whether or not you need emergency care. °? Stay with you until you are better. °General instructions °· Contact your doctor each time you have a seizure. °· Avoid anything that gives you seizures. °· Keep a seizure diary. Write down: °? What  you think caused each seizure. °? What you remember about each seizure. °· Keep all follow-up visits as told by your doctor. This is important. °Contact a doctor if: °· You have another seizure. °· You have seizures more often. °· There is any change in what happens during your seizures. °· You keep having seizures with treatment. °· You have symptoms of being sick or having an infection. °Get help right away if: °· You have a seizure: °? That lasts longer than 5 minutes. °? That is different than seizures you had before. °? That makes it harder to breathe. °? After you hurt your head. °· After a seizure, you cannot speak or use a part of your body. °· After a seizure, you are confused or have a bad headache. °· You have two or more seizures in a row. °· You are having seizures more often. °· You do not wake up right after a seizure. °· You get hurt during a seizure. °In an emergency: °· These symptoms may be an emergency. Do not wait to see if the symptoms will go away. Get medical help right away. Call your local emergency services (911 in the [place] Do not drive yourself to the hospital. °Summary °· Seizures usually last from 30 seconds to 2 minutes. Usually, they are not harmful unless they last a long time. °· Do not eat or drink anything that may keep your medicine from working, such as alcohol. °·   Teach friends and family what to do when you have a seizure. °· Contact your doctor each time you have a seizure. °This information is not intended to replace advice given to you by your health care provider. Make sure you discuss any questions you have with your health care provider. °Document Released: 02/02/2008 Document Revised: 05/10/2018 Document Reviewed: 09/22/2017 °Elsevier Interactive Patient Education © 2019 [employer] ° °

## 2019-01-08 NOTE — Assessment & Plan Note (Signed)
Chronic, stable with no recent seizure activity.  Continue Keppra.  Plan on face to face visit in June and will obtain Keppra level + CMP.

## 2019-01-08 NOTE — Progress Notes (Signed)
BP 129/69 (BP Location: Right Arm, Patient Position: Sitting, Cuff Size: Normal)   Pulse 62   Temp (!) 97.4 F (36.3 C) (Oral)   Ht '5\' 2"'  (1.575 m)   Wt 138 lb (62.6 kg)   LMP  (LMP Unknown)   BMI 25.24 kg/m    Subjective:    Patient ID: Renee Lopez, female    DOB: [birth year], 78 y.o.   MRN: [id number]  HPI: Renee Lopez is a 78 y.o. female  Chief Complaint  Patient presents with  . Elevated alkaline phosphatase level    6 month F/U  . Elevated transaminase level  . Arthritis     . This visit was completed via telephone due to the restrictions of the COVID-19 pandemic. All issues as above were discussed and addressed but no physical exam was performed. If it was felt that the patient should be evaluated in the office, they were directed there. The patient verbally consented to this visit. Patient was unable to complete an audio/visual visit due to Lack of equipment. Due to the catastrophic nature of the COVID-19 pandemic, this visit was done through audio contact only. . Location of the patient: home . Location of the provider: home . Those involved with this call:  . Provider: Marnee Guarneri, DNP . CMA: Gerda Diss, CMA . Front Desk/Registration: Jill Side  . Time spent on call: 15 minutes on the phone discussing health concerns. 10 minutes total spent in review of patient's record and preparation of their chart. I verified patient identity using two factors (patient name and date of birth). Patient consents verbally to being seen via telemedicine visit today.   ELEVATED ALK PHOS, SGOT, AND SGPT: Denies alcohol use.  Is on Keppra for seizure disorder with recent level in November 2019 13.1.  Recent LFT November 2019 noted WNL, including lipase and amylase.  Denies abdominal pain, N&V, fever, SOB, or CP.    ARTHRITIS: Has received injections in past in knees, she reports first one lasted a long time and second one did not work so she does not want anymore.   Affects bilateral knees and shoulders.  Knee braces work, occasionally uses creams but they do not always work.  States "I have learned to live with it".  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  Denies any recent seizure activity.  Last level in November 2019.     Relevant past medical, surgical, family and social history reviewed and updated as indicated. Interim medical history since our last visit reviewed. Allergies and medications reviewed and updated.  Review of Systems  Constitutional: Negative for activity change, appetite change, diaphoresis, fatigue and fever.  Respiratory: Negative for cough, chest tightness and shortness of breath.   Cardiovascular: Negative for chest pain, palpitations and leg swelling.  Gastrointestinal: Negative for abdominal distention, abdominal pain, constipation, diarrhea, nausea and vomiting.  Musculoskeletal: Positive for arthralgias.  Neurological: Negative for dizziness, seizures, syncope, weakness, light-headedness, numbness and headaches.  Psychiatric/Behavioral: Negative.     Per HPI unless specifically indicated above     Objective:    BP 129/69 (BP Location: Right Arm, Patient Position: Sitting, Cuff Size: Normal)   Pulse 62   Temp (!) 97.4 F (36.3 C) (Oral)   Ht '5\' 2"'  (1.575 m)   Wt 138 lb (62.6 kg)   LMP  (LMP  Unknown)   BMI 25.24 kg/m   Wt Readings from Last 3 Encounters:  01/08/19 138 lb (62.6 kg)  06/30/18 140 lb (63.5 kg)  06/02/18 140 lb 12.8 oz (63.9 kg)    Physical Exam   Unable to perform due to lack of equipment by patient.  Results for orders placed or performed in visit on 06/30/18  Gamma GT  Result Value Ref Range   GGT 33 0 - 60 IU/L  Amylase  Result Value Ref Range   Amylase 89 31 - 124 U/L  Lipase  Result Value Ref Range    Lipase 14 14 - 85 U/L  Comprehensive metabolic panel  Result Value Ref Range   Glucose 82 65 - 99 mg/dL   BUN 12 8 - 27 mg/dL   Creatinine, Ser 0.98 0.57 - 1.00 mg/dL   GFR calc non Af Amer 55 (L) >59 mL/min/1.73   GFR calc Af Amer 63 >59 mL/min/1.73   BUN/Creatinine Ratio 12 12 - 28   Sodium 144 134 - 144 mmol/L   Potassium 4.1 3.5 - 5.2 mmol/L   Chloride 107 (H) 96 - 106 mmol/L   CO2 22 20 - 29 mmol/L   Calcium 9.4 8.7 - 10.3 mg/dL   Total Protein 6.6 6.0 - 8.5 g/dL   Albumin 4.2 3.5 - 4.8 g/dL   Globulin, Total 2.4 1.5 - 4.5 g/dL   Albumin/Globulin Ratio 1.8 1.2 - 2.2   Bilirubin Total 0.9 0.0 - 1.2 mg/dL   Alkaline Phosphatase 117 39 - 117 IU/L   AST 19 0 - 40 IU/L   ALT 12 0 - 32 IU/L  Levetiracetam level  Result Value Ref Range   Levetiracetam Lvl 13.1 10.0 - 40.0 ug/mL  CBC with Differential/Platelet  Result Value Ref Range   WBC 3.8 3.4 - 10.8 x10E3/uL   RBC 4.62 3.77 - 5.28 x10E6/uL   Hemoglobin 13.2 11.1 - 15.9 g/dL   Hematocrit 40.5 34.0 - 46.6 %   MCV 88 79 - 97 fL   MCH 28.6 26.6 - 33.0 pg   MCHC 32.6 31.5 - 35.7 g/dL   RDW 12.8 12.3 - 15.4 %   Platelets 245 150 - 450 x10E3/uL   Neutrophils 57 Not Estab. %   Lymphs 27 Not Estab. %   Monocytes 11 Not Estab. %   Eos 4 Not Estab. %   Basos 1 Not Estab. %   Neutrophils Absolute 2.2 1.4 - 7.0 x10E3/uL   Lymphocytes Absolute 1.0 0.7 - 3.1 x10E3/uL   Monocytes Absolute 0.4 0.1 - 0.9 x10E3/uL   EOS (ABSOLUTE) 0.2 0.0 - 0.4 x10E3/uL   Basophils Absolute 0.0 0.0 - 0.2 x10E3/uL   Immature Granulocytes 0 Not Estab. %   Immature Grans (Abs) 0.0 0.0 - 0.1 x10E3/uL      Assessment & Plan:   Problem List Items Addressed This Visit      Nervous and Auditory   Focal epilepsy (HCC) - Primary    Chronic, stable with no recent seizure activity.  Continue Keppra.  Plan on face to face visit in June and will obtain Keppra level + CMP.        Musculoskeletal and Integument   Osteoarthritis    Chronic, stable.   Recommend use of OTC Voltaren gel and continue daily gentle stretching and activity.  May also rotate between heat/ice for discomfort + wear knee braces.  Does not want further injections.        Other   Elevated alkaline phosphatase  level    WNL on November labs, plan on face to face visit in June and will recheck.         I discussed the assessment and treatment plan with the patient. The patient was provided an opportunity to ask questions and all were answered. The patient agreed with the plan and demonstrated an understanding of the instructions.   The patient was advised to call back or seek an in-person evaluation if the symptoms worsen or if the condition fails to improve as anticipated.   I provided 15 minutes of time during this encounter.  Follow up plan: Return in about 7 weeks (around 02/26/2019) for Elevated Alk Phos, Seizure disorder, HTN, OA.

## 2019-01-08 NOTE — Assessment & Plan Note (Signed)
WNL on November labs, plan on face to face visit in June and will recheck.

## 2019-02-27 ENCOUNTER — Ambulatory Visit: Payer: Medicare PPO | Admitting: Nurse Practitioner

## 2019-03-01 ENCOUNTER — Other Ambulatory Visit: Payer: Self-pay | Admitting: Nurse Practitioner

## 2019-03-01 NOTE — Telephone Encounter (Signed)
Forwarding medication refill to PCP for review. 

## 2019-03-24 ENCOUNTER — Encounter: Payer: Self-pay | Admitting: Nurse Practitioner

## 2019-03-26 ENCOUNTER — Encounter: Payer: Self-pay | Admitting: Nurse Practitioner

## 2019-03-26 ENCOUNTER — Other Ambulatory Visit: Payer: Self-pay

## 2019-03-26 ENCOUNTER — Ambulatory Visit (INDEPENDENT_AMBULATORY_CARE_PROVIDER_SITE_OTHER): Payer: Medicare PPO | Admitting: Nurse Practitioner

## 2019-03-26 VITALS — BP 135/76 | HR 57 | Temp 96.1°F | Wt 137.5 lb

## 2019-03-26 DIAGNOSIS — G40109 Localization-related (focal) (partial) symptomatic epilepsy and epileptic syndromes with simple partial seizures, not intractable, without status epilepticus: Secondary | ICD-10-CM | POA: Diagnosis not present

## 2019-03-26 DIAGNOSIS — E7800 Pure hypercholesterolemia, unspecified: Secondary | ICD-10-CM | POA: Diagnosis not present

## 2019-03-26 DIAGNOSIS — I10 Essential (primary) hypertension: Secondary | ICD-10-CM

## 2019-03-26 MED ORDER — ATORVASTATIN CALCIUM 10 MG PO TABS: 10.0000 mg | ORAL_TABLET | Freq: Every day | ORAL | 3 refills | Status: DC

## 2019-03-26 MED ORDER — FAMOTIDINE 40 MG PO TABS: ORAL_TABLET | ORAL | 2 refills | Status: DC

## 2019-03-26 NOTE — Assessment & Plan Note (Addendum)
Chronic, ongoing with BP at goal for age at home.  Continue current medication regimen and adjust as needed.  Refuses to have labs or leave home due to Covid 19, will plan on labs at 6 month follow-up or sooner if possible.   

## 2019-03-26 NOTE — Assessment & Plan Note (Addendum)
Chronic, ongoing.  Continue current medication regimen and adjust as needed.  Refuses to have labs or leave home due to Covid 19, will plan on labs at 6 month follow-up or sooner if possible.  .   

## 2019-03-26 NOTE — Assessment & Plan Note (Signed)
Chronic, stable on Keppra with no recent seizure activity.  Refuses to have labs or leave home due to Covid 19, will plan on labs at 6 month follow-up or sooner if possible.   

## 2019-03-26 NOTE — Progress Notes (Signed)
BP 135/76 Comment: pt reported  Pulse (!) 57 Comment: pt reported  Temp (!) 96.1 F (35.6 C) (Oral) Comment: pt reported  Wt 137 lb 8 oz (62.4 kg) Comment: pt reported  LMP  (LMP Unknown)   BMI 25.15 kg/m    Subjective:    Patient ID: Renee Lopez, female    DOB: [birth year], 78 y.o.   MRN: [id number]  HPI: Renee Lopez is a 78 y.o. female  Chief Complaint  Patient presents with  . Hyperlipidemia  . Hypertension  . Seizures    . This visit was completed via telephone due to the restrictions of the COVID-19 pandemic. All issues as above were discussed and addressed but no physical exam was performed. If it was felt that the patient should be evaluated in the office, they were directed there. The patient verbally consented to this visit. Patient was unable to complete an audio/visual visit due to Lack of equipment. Due to the catastrophic nature of the COVID-19 pandemic, this visit was done through audio contact only. . Location of the patient: home . Location of the provider: work . Those involved with this call:  . Provider: Marnee Guarneri, DNP . CMA: Yvonna Alanis, CMA . Front Desk/Registration: Jill Side  . Time spent on call: 15 minutes on the phone discussing health concerns. 10 minutes total spent in review of patient's record and preparation of their chart.  . I verified patient identity using two factors (patient name and date of birth). Patient consents verbally to being seen via telemedicine visit today.    HYPERTENSION / HYPERLIPIDEMIA Continues on ASA, Lipitor, and Micardis.  Does not wish to come in for labs due to Covid 19, has not been leaving home. Satisfied with current treatment? yes Duration of hypertension: chronic BP monitoring frequency: daily BP range: 130/70's average BP medication side effects: no Duration of hyperlipidemia: chronic Cholesterol medication side effects: no Cholesterol supplements: none Medication compliance:  good compliance Aspirin: yes Recent stressors: no Recurrent headaches: no Visual changes: no Palpitations: no Dyspnea: no Chest pain: no Lower extremity edema: no Dizzy/lightheaded: no   SEIZURE DISORDER: Continues on Keppra 500 MG BID.  Last level November 2019 = 13.1.  Denies alcohol use.  Recent LFT November 2019 noted WNL, including lipase and amylase.  Denies abdominal pain, N&V, fever, SOB, or CP.  Has not had a seizure in several years.  Relevant past medical, surgical, family and social history reviewed and updated as indicated. Interim medical history since our last visit reviewed. Allergies and medications reviewed and updated.  Review of Systems  Constitutional: Negative for activity change, appetite change, diaphoresis, fatigue and fever.  Respiratory: Negative for cough, chest tightness and shortness of breath.   Cardiovascular: Negative for chest pain, palpitations and leg swelling.  Gastrointestinal: Negative for abdominal distention, abdominal pain, constipation, diarrhea, nausea and vomiting.  Neurological: Negative for dizziness, syncope, weakness, light-headedness, numbness and headaches.  Psychiatric/Behavioral: Negative.     Per HPI unless specifically indicated above     Objective:    BP 135/76 Comment: pt reported  Pulse (!) 57 Comment: pt reported  Temp (!) 96.1 F (35.6 C) (Oral) Comment: pt reported  Wt 137 lb 8 oz (62.4 kg) Comment: pt reported  LMP  (LMP Unknown)   BMI 25.15 kg/m   Wt Readings from Last 3 Encounters:  03/26/19 137 lb 8 oz (62.4 kg)  01/08/19 138 lb (62.6 kg)  06/30/18 140 lb (63.5 kg)    Physical Exam  Unable to perform due to telephone visit only.  Results for orders placed or performed in visit on 06/30/18  Gamma GT  Result Value Ref Range   GGT 33 0 - 60 IU/L  Amylase  Result Value Ref Range   Amylase 89 31 - 124 U/L  Lipase  Result Value Ref Range   Lipase 14 14 - 85 U/L  Comprehensive metabolic panel  Result  Value Ref Range   Glucose 82 65 - 99 mg/dL   BUN 12 8 - 27 mg/dL   Creatinine, Ser 7.820.98 0.57 - 1.00 mg/dL   GFR calc non Af Amer 55 (L) >59 mL/min/1.73   GFR calc Af Amer 63 >59 mL/min/1.73   BUN/Creatinine Ratio 12 12 - 28   Sodium 144 134 - 144 mmol/L   Potassium 4.1 3.5 - 5.2 mmol/L   Chloride 107 (H) 96 - 106 mmol/L   CO2 22 20 - 29 mmol/L   Calcium 9.4 8.7 - 10.3 mg/dL   Total Protein 6.6 6.0 - 8.5 g/dL   Albumin 4.2 3.5 - 4.8 g/dL   Globulin, Total 2.4 1.5 - 4.5 g/dL   Albumin/Globulin Ratio 1.8 1.2 - 2.2   Bilirubin Total 0.9 0.0 - 1.2 mg/dL   Alkaline Phosphatase 117 39 - 117 IU/L   AST 19 0 - 40 IU/L   ALT 12 0 - 32 IU/L  Levetiracetam level  Result Value Ref Range   Levetiracetam Lvl 13.1 10.0 - 40.0 ug/mL  CBC with Differential/Platelet  Result Value Ref Range   WBC 3.8 3.4 - 10.8 x10E3/uL   RBC 4.62 3.77 - 5.28 x10E6/uL   Hemoglobin 13.2 11.1 - 15.9 g/dL   Hematocrit 95.640.5 21.334.0 - 46.6 %   MCV 88 79 - 97 fL   MCH 28.6 26.6 - 33.0 pg   MCHC 32.6 31.5 - 35.7 g/dL   RDW 08.612.8 57.812.3 - 46.915.4 %   Platelets 245 150 - 450 x10E3/uL   Neutrophils 57 Not Estab. %   Lymphs 27 Not Estab. %   Monocytes 11 Not Estab. %   Eos 4 Not Estab. %   Basos 1 Not Estab. %   Neutrophils Absolute 2.2 1.4 - 7.0 x10E3/uL   Lymphocytes Absolute 1.0 0.7 - 3.1 x10E3/uL   Monocytes Absolute 0.4 0.1 - 0.9 x10E3/uL   EOS (ABSOLUTE) 0.2 0.0 - 0.4 x10E3/uL   Basophils Absolute 0.0 0.0 - 0.2 x10E3/uL   Immature Granulocytes 0 Not Estab. %   Immature Grans (Abs) 0.0 0.0 - 0.1 x10E3/uL      Assessment & Plan:   Problem List Items Addressed This Visit      Cardiovascular and Mediastinum   Essential hypertension    Chronic, ongoing with BP at goal for age at home.  Continue current medication regimen and adjust as needed.  Refuses to have labs or leave home due to Covid 19, will plan on labs at 6 month follow-up or sooner if possible.        Relevant Medications   atorvastatin (LIPITOR) 10 MG  tablet   Other Relevant Orders   Comprehensive metabolic panel     Nervous and Auditory   Focal epilepsy (HCC) - Primary    Chronic, stable on Keppra with no recent seizure activity.  Refuses to have labs or leave home due to Covid 19, will plan on labs at 6 month follow-up or sooner if possible.        Relevant Orders   Levetiracetam level   Comprehensive  metabolic panel     Other   Hyperlipidemia    Chronic, ongoing.  Continue current medication regimen and adjust as needed.  Refuses to have labs or leave home due to Covid 19, will plan on labs at 6 month follow-up or sooner if possible.  .        Relevant Medications   atorvastatin (LIPITOR) 10 MG tablet   Other Relevant Orders   Comprehensive metabolic panel   Lipid Panel w/o Chol/HDL Ratio      I discussed the assessment and treatment plan with the patient. The patient was provided an opportunity to ask questions and all were answered. The patient agreed with the plan and demonstrated an understanding of the instructions.   The patient was advised to call back or seek an in-person evaluation if the symptoms worsen or if the condition fails to improve as anticipated.   I provided 15 minutes of time during this encounter.  Follow up plan: Return in about 6 months (around 09/26/2019) for HTN/HLD, Arthritis, Seizure disorder.

## 2019-03-26 NOTE — Patient Instructions (Signed)
Seizure, Adult A seizure is a sudden burst of abnormal electrical activity in the brain. Seizures usually last from 30 seconds to 2 minutes. They can cause many different symptoms. Usually, seizures are not harmful unless they last a long time. What are the causes? Common causes of this condition include:  Fever or infection.  Conditions that affect the brain, such as: ? A brain abnormality that you were born with. ? A brain or head injury. ? Bleeding in the brain. ? A tumor. ? Stroke. ? Brain disorders such as autism or cerebral palsy.  Low blood sugar.  Conditions that are passed from parent to child (are inherited).  Problems with substances, such as: ? Having a reaction to a drug or a medicine. ? Suddenly stopping the use of a substance (withdrawal). In some cases, the cause may not be known. A person who has repeated seizures over time without a clear cause has a condition called epilepsy. What increases the risk? You are more likely to get this condition if you have:  A family history of epilepsy.  Had a seizure in the past.  A brain disorder.  A history of head injury, lack of oxygen at birth, or strokes. What are the signs or symptoms? There are many types of seizures. The symptoms vary depending on the type of seizure you have. Examples of symptoms during a seizure include:  Shaking (convulsions).  Stiffness in the body.  Passing out (losing consciousness).  Head nodding.  Staring.  Not responding to sound or touch.  Loss of bladder control and bowel control. Some people have symptoms right before and right after a seizure happens. Symptoms before a seizure may include:  Fear.  Worry (anxiety).  Feeling like you may vomit (nauseous).  Feeling like the room is spinning (vertigo).  Feeling like you saw or heard something before (dj vu).  Odd tastes or smells.  Changes in how you see. You may see flashing lights or spots. Symptoms after a  seizure happens can include:  Confusion.  Sleepiness.  Headache.  Weakness on one side of the body. How is this treated? Most seizures will stop on their own in under 5 minutes. In these cases, no treatment is needed. Seizures that last longer than 5 minutes will usually need treatment. Treatment can include:  Medicines given through an IV tube.  Avoiding things that are known to cause your seizures. These can include medicines that you take for another condition.  Medicines to treat epilepsy.  Surgery to stop the seizures. This may be needed if medicines do not help. Follow these instructions at home: Medicines  Take over-the-counter and prescription medicines only as told by your doctor.  Do not eat or drink anything that may keep your medicine from working, such as alcohol. Activity  Do not do any activities that would be dangerous if you had another seizure, like driving or swimming. Wait until your doctor says it is safe for you to do them.  If you live in the [place] ask your local DMV (department of motor vehicles) when you can drive.  Get plenty of rest. Teaching others Teach friends and family what to do when you have a seizure. They should:  Lay you on the ground.  Protect your head and body.  Loosen any tight clothing around your neck.  Turn you on your side.  Not hold you down.  Not put anything into your mouth.  Know whether or not you need emergency care.  Stay   with you until you are better.  General instructions  Contact your doctor each time you have a seizure.  Avoid anything that gives you seizures.  Keep a seizure diary. Write down: ? What you think caused each seizure. ? What you remember about each seizure.  Keep all follow-up visits as told by your doctor. This is important. Contact a doctor if:  You have another seizure.  You have seizures more often.  There is any change in what happens during your seizures.  You keep having  seizures with treatment.  You have symptoms of being sick or having an infection. Get help right away if:  You have a seizure that: ? Lasts longer than 5 minutes. ? Is different than seizures you had before. ? Makes it harder to breathe. ? Happens after you hurt your head.  You have any of these symptoms after a seizure: ? Not being able to speak. ? Not being able to use a part of your body. ? Confusion. ? A bad headache.  You have two or more seizures in a row.  You do not wake up right after a seizure.  You get hurt during a seizure. These symptoms may be an emergency. Do not wait to see if the symptoms will go away. Get medical help right away. Call your local emergency services (911 in the [place] Do not drive yourself to the hospital. Summary  Seizures usually last from 30 seconds to 2 minutes. Usually, they are not harmful unless they last a long time.  Do not eat or drink anything that may keep your medicine from working, such as alcohol.  Teach friends and family what to do when you have a seizure.  Contact your doctor each time you have a seizure. This information is not intended to replace advice given to you by your health care provider. Make sure you discuss any questions you have with your health care provider. Document Released: 02/02/2008 Document Revised: 11/03/2018 Document Reviewed: 11/03/2018 Elsevier Patient Education  2020 Elsevier Inc[employer]  

## 2019-05-28 ENCOUNTER — Ambulatory Visit (INDEPENDENT_AMBULATORY_CARE_PROVIDER_SITE_OTHER): Payer: Medicare PPO

## 2019-05-28 VITALS — BP 142/82 | HR 70 | Temp 96.0°F | Ht 62.0 in | Wt 140.0 lb

## 2019-05-28 DIAGNOSIS — Z0000 Encounter for general adult medical examination without abnormal findings: Secondary | ICD-10-CM

## 2019-05-28 NOTE — Patient Instructions (Signed)
Renee Lopez , Thank you for taking time to come for your Medicare Wellness Visit. I appreciate your ongoing commitment to your health goals. Please review the following plan we discussed and let me know if I can assist you in the future.   Screening recommendations/referrals: Colonoscopy: no longer required Mammogram: no longer required Bone Density: no longer required Recommended yearly ophthalmology/optometry visit for glaucoma screening and checkup Recommended yearly dental visit for hygiene and checkup  Vaccinations: Influenza vaccine: due now Pneumococcal vaccine: up to date Tdap vaccine: up to date Shingles vaccine: shingrix eligible    Advanced directives: Please bring a copy of your health care power of attorney and living will to the office at your convenience.  Conditions/risks identified: none  Next appointment: Follow up in one year for your annual wellness visit    Preventive Care 80 Years and Older, Female Preventive care refers to lifestyle choices and visits with your health care provider that can promote health and wellness. What does preventive care include?  A yearly physical exam. This is also called an annual well check.  Dental exams once or twice a year.  Routine eye exams. Ask your health care provider how often you should have your eyes checked.  Personal lifestyle choices, including:  Daily care of your teeth and gums.  Regular physical activity.  Eating a healthy diet.  Avoiding tobacco and drug use.  Limiting alcohol use.  Practicing safe sex.  Taking low-dose aspirin every day.  Taking vitamin and mineral supplements as recommended by your health care provider. What happens during an annual well check? The services and screenings done by your health care provider during your annual well check will depend on your age, overall health, lifestyle risk factors, and family history of disease. Counseling  Your health care provider may ask  you questions about your:  Alcohol use.  Tobacco use.  Drug use.  Emotional well-being.  Home and relationship well-being.  Sexual activity.  Eating habits.  History of falls.  Memory and ability to understand (cognition).  Work and work Statistician.  Reproductive health. Screening  You may have the following tests or measurements:  Height, weight, and BMI.  Blood pressure.  Lipid and cholesterol levels. These may be checked every 5 years, or more frequently if you are over 66 years old.  Skin check.  Lung cancer screening. You may have this screening every year starting at age 80 if you have a 30-pack-year history of smoking and currently smoke or have quit within the past 15 years.  Fecal occult blood test (FOBT) of the stool. You may have this test every year starting at age 80.  Flexible sigmoidoscopy or colonoscopy. You may have a sigmoidoscopy every 5 years or a colonoscopy every 10 years starting at age 80.  Hepatitis C blood test.  Hepatitis B blood test.  Sexually transmitted disease (STD) testing.  Diabetes screening. This is done by checking your blood sugar (glucose) after you have not eaten for a while (fasting). You may have this done every 1-3 years.  Bone density scan. This is done to screen for osteoporosis. You may have this done starting at age 70.  Mammogram. This may be done every 1-2 years. Talk to your health care provider about how often you should have regular mammograms. Talk with your health care provider about your test results, treatment options, and if necessary, the need for more tests. Vaccines  Your health care provider may recommend certain vaccines, such as:  Influenza  vaccine. This is recommended every year.  Tetanus, diphtheria, and acellular pertussis (Tdap, Td) vaccine. You may need a Td booster every 10 years.  Zoster vaccine. You may need this after age 80.  Pneumococcal 13-valent conjugate (PCV13) vaccine. One dose  is recommended after age 80.  Pneumococcal polysaccharide (PPSV23) vaccine. One dose is recommended after age 80. Talk to your health care provider about which screenings and vaccines you need and how often you need them. This information is not intended to replace advice given to you by your health care provider. Make sure you discuss any questions you have with your health care provider. Document Released: 09/12/2015 Document Revised: 05/05/2016 Document Reviewed: 06/17/2015 Elsevier Interactive Patient Education  2017 [employer] Prevention in the Home Falls can cause injuries. They can happen to people of all ages. There are many things you can do to make your home safe and to help prevent falls. What can I do on the outside of my home?  Regularly fix the edges of walkways and driveways and fix any cracks.  Remove anything that might make you trip as you walk through a door, such as a raised step or threshold.  Trim any bushes or trees on the path to your home.  Use bright outdoor lighting.  Clear any walking paths of anything that might make someone trip, such as rocks or tools.  Regularly check to see if handrails are loose or broken. Make sure that both sides of any steps have handrails.  Any raised decks and porches should have guardrails on the edges.  Have any leaves, snow, or ice cleared regularly.  Use sand or salt on walking paths during winter.  Clean up any spills in your garage right away. This includes oil or grease spills. What can I do in the bathroom?  Use night lights.  Install grab bars by the toilet and in the tub and shower. Do not use towel bars as grab bars.  Use non-skid mats or decals in the tub or shower.  If you need to sit down in the shower, use a plastic, non-slip stool.  Keep the floor dry. Clean up any water that spills on the floor as soon as it happens.  Remove soap buildup in the tub or shower regularly.  Attach bath mats  securely with double-sided non-slip rug tape.  Do not have throw rugs and other things on the floor that can make you trip. What can I do in the bedroom?  Use night lights.  Make sure that you have a light by your bed that is easy to reach.  Do not use any sheets or blankets that are too big for your bed. They should not hang down onto the floor.  Have a firm chair that has side arms. You can use this for support while you get dressed.  Do not have throw rugs and other things on the floor that can make you trip. What can I do in the kitchen?  Clean up any spills right away.  Avoid walking on wet floors.  Keep items that you use a lot in easy-to-reach places.  If you need to reach something above you, use a strong step stool that has a grab bar.  Keep electrical cords out of the way.  Do not use floor polish or wax that makes floors slippery. If you must use wax, use non-skid floor wax.  Do not have throw rugs and other things on the floor that can  make you trip. What can I do with my stairs?  Do not leave any items on the stairs.  Make sure that there are handrails on both sides of the stairs and use them. Fix handrails that are broken or loose. Make sure that handrails are as long as the stairways.  Check any carpeting to make sure that it is firmly attached to the stairs. Fix any carpet that is loose or worn.  Avoid having throw rugs at the top or bottom of the stairs. If you do have throw rugs, attach them to the floor with carpet tape.  Make sure that you have a light switch at the top of the stairs and the bottom of the stairs. If you do not have them, ask someone to add them for you. What else can I do to help prevent falls?  Wear shoes that:  Do not have high heels.  Have rubber bottoms.  Are comfortable and fit you well.  Are closed at the toe. Do not wear sandals.  If you use a stepladder:  Make sure that it is fully opened. Do not climb a closed  stepladder.  Make sure that both sides of the stepladder are locked into place.  Ask someone to hold it for you, if possible.  Clearly mark and make sure that you can see:  Any grab bars or handrails.  First and last steps.  Where the edge of each step is.  Use tools that help you move around (mobility aids) if they are needed. These include:  Canes.  Walkers.  Scooters.  Crutches.  Turn on the lights when you go into a dark area. Replace any light bulbs as soon as they burn out.  Set up your furniture so you have a clear path. Avoid moving your furniture around.  If any of your floors are uneven, fix them.  If there are any pets around you, be aware of where they are.  Review your medicines with your doctor. Some medicines can make you feel dizzy. This can increase your chance of falling. Ask your doctor what other things that you can do to help prevent falls. This information is not intended to replace advice given to you by your health care provider. Make sure you discuss any questions you have with your health care provider. Document Released: 06/12/2009 Document Revised: 01/22/2016 Document Reviewed: 09/20/2014 Elsevier Interactive Patient Education  2017 [employer].

## 2019-05-28 NOTE — Progress Notes (Signed)
Subjective:   Renee Lopez is a 78 y.o. female who presents for Medicare Annual (Subsequent) preventive examination.  This visit is being conducted via phone call  - after an attmept to do on video chat - due to the COVID-19 pandemic. This patient has given me verbal consent via phone to conduct this visit, patient states they are participating from their home address. Some vital signs may be absent or patient reported.   Patient identification: identified by name, DOB, and current address.    Review of Systems:  Cardiac Risk Factors include: advanced age (>58men, >65 women);dyslipidemia;hypertension     Objective:     Vitals: BP (!) 142/82 Comment: pt reported  Pulse 70 Comment: pt reported  Temp (!) 96 F (35.6 C) (Oral) Comment: pt reported  Ht 5\' 2"  (1.575 m) Comment: pt reported  Wt 140 lb (63.5 kg) Comment: pt reported  LMP  (LMP Unknown)   BMI 25.61 kg/m   Body mass index is 25.61 kg/m.  Advanced Directives 05/28/2019 05/26/2018 05/04/2017 05/19/2016  Does Patient Have a Medical Advance Directive? Yes Yes Yes Yes  Type of Advance Directive Living will;Healthcare Power of Attorney Living will;Healthcare Power of [place];Living will [place];Living will  Copy of [place] in Chart? No - copy requested No - copy requested No - copy requested -    Tobacco Social History   Tobacco Use  Smoking Status Never Smoker  Smokeless Tobacco Never Used     Counseling given: Not Answered   Clinical Intake:  Pre-visit preparation completed: Yes  Pain : 0-10 Pain Score: 3  Pain Type: Chronic pain Pain Location: Shoulder Pain Orientation: Left, Right Pain Descriptors / Indicators: Aching Pain Onset: More than a month ago Pain Frequency: Constant Pain Relieving Factors: tylenol  Pain Relieving Factors: tylenol  Nutritional Status: BMI 25 -29 Overweight Diabetes: No  How often do you need to  have someone help you when you read instructions, pamphlets, or other written materials from your doctor or pharmacy?: 1 - Never  Interpreter Needed?: No  Information entered by ::  ,LPN  Past Medical History:  Diagnosis Date  . Arthritis   . Hyperlipidemia   . Hypertension   . Seizures ([place])    Past Surgical History:  Procedure Laterality Date  . CHOLECYSTECTOMY     Family History  Problem Relation Age of Onset  . Seizures Mother   . Hyperlipidemia Mother   . Hypertension Mother   . Cancer Father        colon  . Hypertension Father    Social History   Socioeconomic History  . Marital status: Married    Spouse name: Not on file  . Number of children: Not on file  . Years of education: Not on file  . Highest education level: High school graduate  Occupational History  . Occupation: retired  Scientific laboratory technician  . Financial resource strain: Not hard at all  . Food insecurity    Worry: Never true    Inability: Never true  . Transportation needs    Medical: No    Non-medical: No  Tobacco Use  . Smoking status: Never Smoker  . Smokeless tobacco: Never Used  Substance and Sexual Activity  . Alcohol use: No    Alcohol/week: 0.0 standard drinks  . Drug use: No  . Sexual activity: Yes  Lifestyle  . Physical activity    Days per week: 0 days  Minutes per session: 0 min  . Stress: Not at all  Relationships  . Social connections    Talks on phone: More than three times a week    Gets together: More than three times a week    Attends religious service: More than 4 times per year    Active member of club or organization: No    Attends meetings of clubs or organizations: Never    Relationship status: Married  Other Topics Concern  . Not on file  Social History Narrative  . Not on file    Outpatient Encounter Medications as of 05/28/2019  Medication Sig  . aspirin 81 MG tablet Take 81 mg by mouth daily.  Marland Kitchen. atorvastatin (LIPITOR) 10 MG tablet Take 1  tablet (10 mg total) by mouth daily at 6 PM.  . famotidine (PEPCID) 40 MG tablet TAKE 1 TABLET BY MOUTH EVERY DAY IN THE EVENING  . levETIRAcetam (KEPPRA) 500 MG tablet Take 1 tablet (500 mg total) by mouth 2 (two) times daily.  Marland Kitchen. telmisartan (MICARDIS) 80 MG tablet TAKE 1 TABLET BY MOUTH EVERY DAY   No facility-administered encounter medications on file as of 05/28/2019.     Activities of Daily Living In your present state of health, do you have any difficulty performing the following activities: 05/28/2019  Hearing? N  Comment no hearing aids  Vision? N  Comment eyeglasses, [facility]  Difficulty concentrating or making decisions? N  Walking or climbing stairs? Y  Dressing or bathing? N  Doing errands, shopping? N  Preparing Food and eating ? N  Using the Toilet? N  In the past six months, have you accidently leaked urine? N  Do you have problems with loss of bowel control? N  Managing your Medications? N  Managing your Finances? N  Housekeeping or managing your Housekeeping? N  Some recent data might be hidden    Patient Care Team: Marjie Skiffannady, Jolene T, NP as PCP - General (Nurse Practitioner)    Assessment:   This is a routine wellness examination for Renee Lopez.  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 DIET - INCREASE WATER INTAKE     Recommend drinking at least 6-8 glasses of water a day        Fall Risk: Fall Risk  05/28/2019 05/26/2018 05/04/2017 05/19/2016  Falls in the past year? 0 No No No  Number falls in past yr: 0 - - -  Injury with Fall? 0 - - -    FALL RISK PREVENTION PERTAINING TO THE HOME:  Any stairs in or around the home? Yes, steps going in home If so, are there any without handrails? No   Home free of loose throw rugs in walkways, pet beds, electrical cords,  etc? Yes  Adequate lighting in your home to reduce risk of falls? Yes   ASSISTIVE DEVICES UTILIZED TO PREVENT FALLS:  Life alert? No  Use of a cane, walker or w/c? Yes  cane in the winter outside  Grab bars in the bathroom? No  Shower chair or bench in shower? No  Elevated toilet seat or a handicapped toilet? No   DME ORDERS:  DME order needed?  No   TIMED UP AND GO:  Unable to perform    Depression Screen [facility] Scores 05/28/2019 05/26/2018 05/04/2017 05/19/2016  PHQ -  2 Score 0 0 0 0  PHQ- 9 Score - - - 0     Cognitive Function     6CIT Screen 05/26/2018 05/04/2017  What Year? 0 points 0 points  What month? 0 points 0 points  What time? 0 points 0 points  Count back from 20 0 points 0 points  Months in reverse 0 points 0 points  Repeat phrase 2 points 2 points  Total Score 2 2    Immunization History  Administered Date(s) Administered  . Influenza, High Dose Seasonal PF 06/24/2017, 06/07/2018  . Influenza-Unspecified 06/09/2015, 06/10/2016, 06/21/2017  . Pneumococcal Conjugate-13 05/10/2014  . Pneumococcal Polysaccharide-23 05/19/2016  . Td 01/07/2014    Qualifies for Shingles Vaccine? Yes  Zostavax completed n/a. Due for Shingrix. Education has been provided regarding the importanace of this vaccine. Pt has been advised to call insurance company to determine out of pocket expense. Advised may also receive vaccine at local pharmacy or Health Dept. Verbalized acceptance and understanding.  Tdap: up to date   Flu Vaccine: Due for Flu vaccine. Will get at pharmacy   Pneumococcal Vaccine: up to date   Screening Tests Health Maintenance  Topic Date Due  . INFLUENZA VACCINE  03/31/2019  . TETANUS/TDAP  01/08/2024  . PNA vac Low Risk Adult  Completed  . DEXA SCAN  Discontinued    Cancer Screenings:  Colorectal Screening: no longer required   Mammogram: no longer required   Bone Density: no longer required   Lung Cancer Screening: (Low Dose CT Chest  recommended if Age 62-80 years, 30 pack-year currently smoking OR have quit w/in 15years.) does not qualify.    Additional Screening:  Hepatitis C Screening: does not qualify  Vision Screening: Recommended annual ophthalmology exams for early detection of glaucoma and other disorders of the eye. Is the patient up to date with their annual eye exam?  Yes  Who is the provider or what is the name of the office in which the pt attends annual [facility] exams? [facility]    Dental Screening: Recommended annual dental exams for proper oral hygiene  Community Resource Referral:  CRR required this visit?  No       Plan:  I have personally reviewed and addressed the Medicare Annual Wellness questionnaire and have noted the following in the patient's chart:  A. Medical and social history B. Use of alcohol, tobacco or illicit drugs  C. Current medications and supplements D. Functional ability and status E.  Nutritional status F.  Physical activity G. Advance directives H. List of other physicians I.  Hospitalizations, surgeries, and ER visits in previous 12 months J.  Vitals K. Screenings such as hearing and vision if needed, cognitive and depression L. Referrals and appointments   In addition, I have reviewed and discussed with patient certain preventive protocols, quality metrics, and best practice recommendations. A written personalized care plan for preventive services as well as general preventive health recommendations were provided to patient.  Signed,    Collene Schlichter, LPN  [id number] Nurse Health Advisor   Nurse Notes: none

## 2019-08-16 ENCOUNTER — Other Ambulatory Visit: Payer: Self-pay | Admitting: Nurse Practitioner

## 2019-08-16 NOTE — Telephone Encounter (Signed)
Forwarding medication refill request to PCP for review. 

## 2019-09-05 ENCOUNTER — Other Ambulatory Visit: Payer: Self-pay

## 2019-09-05 MED ORDER — LEVETIRACETAM 500 MG PO TABS: 500.0000 mg | ORAL_TABLET | Freq: Two times a day (BID) | ORAL | 3 refills | Status: DC

## 2019-09-05 NOTE — Telephone Encounter (Signed)
Patient last seen 03/26/19 and has appointment 10/01/19

## 2019-10-01 ENCOUNTER — Ambulatory Visit (INDEPENDENT_AMBULATORY_CARE_PROVIDER_SITE_OTHER): Payer: Medicare PPO | Admitting: Nurse Practitioner

## 2019-10-01 ENCOUNTER — Encounter: Payer: Self-pay | Admitting: Nurse Practitioner

## 2019-10-01 VITALS — BP 129/85 | HR 60 | Temp 97.9°F | Wt 137.0 lb

## 2019-10-01 DIAGNOSIS — E7800 Pure hypercholesterolemia, unspecified: Secondary | ICD-10-CM

## 2019-10-01 DIAGNOSIS — I10 Essential (primary) hypertension: Secondary | ICD-10-CM | POA: Diagnosis not present

## 2019-10-01 DIAGNOSIS — G40109 Localization-related (focal) (partial) symptomatic epilepsy and epileptic syndromes with simple partial seizures, not intractable, without status epilepticus: Secondary | ICD-10-CM | POA: Diagnosis not present

## 2019-10-01 MED ORDER — TELMISARTAN 80 MG PO TABS: 80.0000 mg | ORAL_TABLET | Freq: Every day | ORAL | 3 refills | Status: DC

## 2019-10-01 NOTE — Assessment & Plan Note (Signed)
Chronic, ongoing.  Continue current medication regimen and adjust as needed.  Refuses to have labs or leave home due to Covid 19, will plan on labs at 6 month follow-up or sooner if possible.  Renee Lopez

## 2019-10-01 NOTE — Patient Instructions (Signed)
Seizure, Adult °A seizure is a sudden burst of abnormal electrical activity in the brain. Seizures usually last from 30 seconds to 2 minutes. They can cause many different symptoms. °Usually, seizures are not harmful unless they last a long time. °What are the causes? °Common causes of this condition include: °· Fever or infection. °· Conditions that affect the brain, such as: °? A brain abnormality that you were born with. °? A brain or head injury. °? Bleeding in the brain. °? A tumor. °? Stroke. °? Brain disorders such as autism or cerebral palsy. °· Low blood sugar. °· Conditions that are passed from parent to child (are inherited). °· Problems with substances, such as: °? Having a reaction to a drug or a medicine. °? Suddenly stopping the use of a substance (withdrawal). °In some cases, the cause may not be known. A person who has repeated seizures over time without a clear cause has a condition called epilepsy. °What increases the risk? °You are more likely to get this condition if you have: °· A family history of epilepsy. °· Had a seizure in the past. °· A brain disorder. °· A history of head injury, lack of oxygen at birth, or strokes. °What are the signs or symptoms? °There are many types of seizures. The symptoms vary depending on the type of seizure you have. Examples of symptoms during a seizure include: °· Shaking (convulsions). °· Stiffness in the body. °· Passing out (losing consciousness). °· Head nodding. °· Staring. °· Not responding to sound or touch. °· Loss of bladder control and bowel control. °Some people have symptoms right before and right after a seizure happens. °Symptoms before a seizure may include: °· Fear. °· Worry (anxiety). °· Feeling like you may vomit (nauseous). °· Feeling like the room is spinning (vertigo). °· Feeling like you saw or heard something before (déjà vu). °· Odd tastes or smells. °· Changes in how you see. You may see flashing lights or spots. °Symptoms after a  seizure happens can include: °· Confusion. °· Sleepiness. °· Headache. °· Weakness on one side of the body. °How is this treated? °Most seizures will stop on their own in under 5 minutes. In these cases, no treatment is needed. Seizures that last longer than 5 minutes will usually need treatment. Treatment can include: °· Medicines given through an IV tube. °· Avoiding things that are known to cause your seizures. These can include medicines that you take for another condition. °· Medicines to treat epilepsy. °· Surgery to stop the seizures. This may be needed if medicines do not help. °Follow these instructions at home: °Medicines °· Take over-the-counter and prescription medicines only as told by your doctor. °· Do not eat or drink anything that may keep your medicine from working, such as alcohol. °Activity °· Do not do any activities that would be dangerous if you had another seizure, like driving or swimming. Wait until your doctor says it is safe for you to do them. °· If you live in the [place] ask your local DMV (department of motor vehicles) when you can drive. °· Get plenty of rest. °Teaching others °Teach friends and family what to do when you have a seizure. They should: °· Lay you on the ground. °· Protect your head and body. °· Loosen any tight clothing around your neck. °· Turn you on your side. °· Not hold you down. °· Not put anything into your mouth. °· Know whether or not you need emergency care. °· Stay   with you until you are better. ° °General instructions °· Contact your doctor each time you have a seizure. °· Avoid anything that gives you seizures. °· Keep a seizure diary. Write down: °? What you think caused each seizure. °? What you remember about each seizure. °· Keep all follow-up visits as told by your doctor. This is important. °Contact a doctor if: °· You have another seizure. °· You have seizures more often. °· There is any change in what happens during your seizures. °· You keep having  seizures with treatment. °· You have symptoms of being sick or having an infection. °Get help right away if: °· You have a seizure that: °? Lasts longer than 5 minutes. °? Is different than seizures you had before. °? Makes it harder to breathe. °? Happens after you hurt your head. °· You have any of these symptoms after a seizure: °? Not being able to speak. °? Not being able to use a part of your body. °? Confusion. °? A bad headache. °· You have two or more seizures in a row. °· You do not wake up right after a seizure. °· You get hurt during a seizure. °These symptoms may be an emergency. Do not wait to see if the symptoms will go away. Get medical help right away. Call your local emergency services (911 in the [place] Do not drive yourself to the hospital. °Summary °· Seizures usually last from 30 seconds to 2 minutes. Usually, they are not harmful unless they last a long time. °· Do not eat or drink anything that may keep your medicine from working, such as alcohol. °· Teach friends and family what to do when you have a seizure. °· Contact your doctor each time you have a seizure. °This information is not intended to replace advice given to you by your health care provider. Make sure you discuss any questions you have with your health care provider. °Document Revised: 11/03/2018 Document Reviewed: 11/03/2018 °Elsevier Patient Education © 2020 [employer] ° °

## 2019-10-01 NOTE — Progress Notes (Signed)
BP 129/85   Pulse 60   Temp 97.9 F (36.6 C)   Wt 137 lb (62.1 kg)   LMP  (LMP Unknown)   BMI 25.06 kg/m    Subjective:    Patient ID: Renee Lopez, female    DOB: December 06, 1940, 79 y.o.   MRN: [id number]  HPI: Renee Lopez is a 79 y.o. female  Chief Complaint  Patient presents with  . Hyperlipidemia  . Hypertension    This visit was completed via telephone due to the restrictions of the COVID-19 pandemic. All issues as above were discussed and addressed but no physical exam was performed. If it was felt that the patient should be evaluated in the office, they were directed there. The patient verbally consented to this visit. Patient was unable to complete an audio/visual visit due to Lack of equipment. Due to the catastrophic nature of the COVID-19 pandemic, this visit was done through audio contact only.  Location of the patient: home  Location of the provider: work  Those involved with this call:  ? Provider: Aura Dials, DNP ? CMA: Wilhemena Durie, CMA ? Front Desk/Registration: Harriet Pho   Time spent on call: 15 minutes on the phone discussing health concerns. 10 minutes total spent in review of patient's record and preparation of their chart.   I verified patient identity using two factors (patient name and date of birth). Patient consents verbally to being seen via telemedicine visit today.   HYPERTENSION / HYPERLIPIDEMIA Continues on ASA, Lipitor, and Micardis.  Does not wish to come in for labs due to Covid 19, has not been leaving home.  Does plan on getting Covid vaccine once able to get appointment. Satisfied with current treatment? yes Duration of hypertension: chronic BP monitoring frequency: daily BP range: 130/70's average BP medication side effects: no Duration of hyperlipidemia: chronic Cholesterol medication side effects: no Cholesterol supplements: none Medication compliance: good compliance Aspirin: yes Recent stressors:  no Recurrent headaches: no Visual changes: no Palpitations: no Dyspnea: no Chest pain: no Lower extremity edema: no Dizzy/lightheaded: no   SEIZURE DISORDER: Continues on Keppra 500 MG BID.  Last level November 2019 = 13.1.  Denies alcohol use. Recent LFT November 2019 noted WNL, including lipase and amylase. Denies abdominal pain, N&V, fever, SOB, or CP. Has not had a seizure in several years, "way back when I had to go to [facility]".  Only has history of 3 seizures.  Relevant past medical, surgical, family and social history reviewed and updated as indicated. Interim medical history since our last visit reviewed. Allergies and medications reviewed and updated.  Review of Systems  Constitutional: Negative for activity change, appetite change, diaphoresis, fatigue and fever.  Respiratory: Negative for cough, chest tightness and shortness of breath.   Cardiovascular: Negative for chest pain, palpitations and leg swelling.  Gastrointestinal: Negative.   Neurological: Negative for dizziness, seizures, syncope, weakness, light-headedness, numbness and headaches.  Psychiatric/Behavioral: Negative.     Per HPI unless specifically indicated above     Objective:    BP 129/85   Pulse 60   Temp 97.9 F (36.6 C)   Wt 137 lb (62.1 kg)   LMP  (LMP Unknown)   BMI 25.06 kg/m   Wt Readings from Last 3 Encounters:  10/01/19 137 lb (62.1 kg)  05/28/19 140 lb (63.5 kg)  03/26/19 137 lb 8 oz (62.4 kg)    Physical Exam   Unable to perform due to telephone visit only.  Results for orders  placed or performed in visit on 06/30/18  Gamma GT  Result Value Ref Range   GGT 33 0 - 60 IU/L  Amylase  Result Value Ref Range   Amylase 89 31 - 124 U/L  Lipase  Result Value Ref Range   Lipase 14 14 - 85 U/L  Comprehensive metabolic panel  Result Value Ref Range   Glucose 82 65 - 99 mg/dL   BUN 12 8 - 27 mg/dL   Creatinine, Ser 0.98 0.57 - 1.00 mg/dL   GFR calc non Af Amer 55 (L) >59  mL/min/1.73   GFR calc Af Amer 63 >59 mL/min/1.73   BUN/Creatinine Ratio 12 12 - 28   Sodium 144 134 - 144 mmol/L   Potassium 4.1 3.5 - 5.2 mmol/L   Chloride 107 (H) 96 - 106 mmol/L   CO2 22 20 - 29 mmol/L   Calcium 9.4 8.7 - 10.3 mg/dL   Total Protein 6.6 6.0 - 8.5 g/dL   Albumin 4.2 3.5 - 4.8 g/dL   Globulin, Total 2.4 1.5 - 4.5 g/dL   Albumin/Globulin Ratio 1.8 1.2 - 2.2   Bilirubin Total 0.9 0.0 - 1.2 mg/dL   Alkaline Phosphatase 117 39 - 117 IU/L   AST 19 0 - 40 IU/L   ALT 12 0 - 32 IU/L  Levetiracetam level  Result Value Ref Range   Levetiracetam Lvl 13.1 10.0 - 40.0 ug/mL  CBC with Differential/Platelet  Result Value Ref Range   WBC 3.8 3.4 - 10.8 x10E3/uL   RBC 4.62 3.77 - 5.28 x10E6/uL   Hemoglobin 13.2 11.1 - 15.9 g/dL   Hematocrit 40.5 34.0 - 46.6 %   MCV 88 79 - 97 fL   MCH 28.6 26.6 - 33.0 pg   MCHC 32.6 31.5 - 35.7 g/dL   RDW 12.8 12.3 - 15.4 %   Platelets 245 150 - 450 x10E3/uL   Neutrophils 57 Not Estab. %   Lymphs 27 Not Estab. %   Monocytes 11 Not Estab. %   Eos 4 Not Estab. %   Basos 1 Not Estab. %   Neutrophils Absolute 2.2 1.4 - 7.0 x10E3/uL   Lymphocytes Absolute 1.0 0.7 - 3.1 x10E3/uL   Monocytes Absolute 0.4 0.1 - 0.9 x10E3/uL   EOS (ABSOLUTE) 0.2 0.0 - 0.4 x10E3/uL   Basophils Absolute 0.0 0.0 - 0.2 x10E3/uL   Immature Granulocytes 0 Not Estab. %   Immature Grans (Abs) 0.0 0.0 - 0.1 x10E3/uL      Assessment & Plan:   Problem List Items Addressed This Visit      Cardiovascular and Mediastinum   Essential hypertension    Chronic, ongoing with BP at goal for age at home.  Continue current medication regimen and adjust as needed.  Refuses to have labs or leave home due to Covid 19, will plan on labs at 6 month follow-up or sooner if possible.        Relevant Medications   telmisartan (MICARDIS) 80 MG tablet     Nervous and Auditory   Focal epilepsy (HCC)    Chronic, stable on Keppra with no recent seizure activity.  Refuses to have labs  or leave home due to Covid 19, will plan on labs at 6 month follow-up or sooner if possible.          Other   Hyperlipidemia    Chronic, ongoing.  Continue current medication regimen and adjust as needed.  Refuses to have labs or leave home due to Covid 19, will  plan on labs at 6 month follow-up or sooner if possible.  .        Relevant Medications   telmisartan (MICARDIS) 80 MG tablet       Follow up plan: Return in about 6 months (around 03/30/2020) for HTN/HLD, Seizure disorder, GERD.

## 2019-10-01 NOTE — Assessment & Plan Note (Signed)
Chronic, stable on Keppra with no recent seizure activity.  Refuses to have labs or leave home due to Covid 19, will plan on labs at 6 month follow-up or sooner if possible.

## 2019-10-01 NOTE — Assessment & Plan Note (Signed)
Chronic, ongoing with BP at goal for age at home.  Continue current medication regimen and adjust as needed.  Refuses to have labs or leave home due to Covid 19, will plan on labs at 6 month follow-up or sooner if possible.

## 2019-10-02 ENCOUNTER — Encounter: Payer: Self-pay | Admitting: Nurse Practitioner

## 2019-10-02 NOTE — Progress Notes (Signed)
Unable to lvm to make this 6 month f/u will send letter.

## 2019-11-15 ENCOUNTER — Ambulatory Visit: Payer: Medicare PPO | Attending: Internal Medicine

## 2019-11-15 DIAGNOSIS — Z23 Encounter for immunization: Secondary | ICD-10-CM

## 2019-11-15 NOTE — Progress Notes (Signed)
   Covid-19 Vaccination Clinic  Name:  Renee Lopez    MRN: [id number] DOB: 22-May-1941  11/15/2019  Renee Lopez was observed post Covid-19 immunization for 15 minutes without incident. She was provided with Vaccine Information Sheet and instruction to access the V-Safe system.   Renee Lopez was instructed to call 911 with any severe reactions post vaccine: Marland Kitchen Difficulty breathing  . Swelling of face and throat  . A fast heartbeat  . A bad rash all over body  . Dizziness and weakness   Immunizations Administered    Name Date Dose VIS Date Route   Pfizer COVID-19 Vaccine 11/15/2019  9:44 AM 0.3 mL 08/10/2019 Intramuscular   Manufacturer: [employer], [employer]   Lot: [id number]   NDC: [address]-1

## 2019-11-24 ENCOUNTER — Ambulatory Visit: Payer: Medicare PPO

## 2019-12-11 ENCOUNTER — Ambulatory Visit: Payer: Medicare PPO | Attending: Internal Medicine

## 2019-12-11 DIAGNOSIS — Z23 Encounter for immunization: Secondary | ICD-10-CM

## 2019-12-11 NOTE — Progress Notes (Signed)
   Covid-19 Vaccination Clinic  Name:  Renee Lopez    MRN: [id number] DOB: 12-Mar-1941  12/11/2019  Renee Lopez was observed post Covid-19 immunization for 15 minutes without incident. She was provided with Vaccine Information Sheet and instruction to access the V-Safe system.   Renee Lopez was instructed to call 911 with any severe reactions post vaccine: Marland Kitchen Difficulty breathing  . Swelling of face and throat  . A fast heartbeat  . A bad rash all over body  . Dizziness and weakness   Immunizations Administered    Name Date Dose VIS Date Route   Pfizer COVID-19 Vaccine 12/11/2019 10:09 AM 0.3 mL 08/10/2019 Intramuscular   Manufacturer: [employer], [employer]   Lot: [id number]   NDC: [address]-6

## 2020-03-22 ENCOUNTER — Other Ambulatory Visit: Payer: Self-pay | Admitting: Nurse Practitioner

## 2020-03-22 NOTE — Telephone Encounter (Signed)
Requested medication (s) are due for refill today: yes  Requested medication (s) are on the active medication list: yes  Last refill:  03/26/19  Future visit scheduled: yes  Notes to clinic:  overdue lab work   Requested Prescriptions  Pending Prescriptions Disp Refills   atorvastatin (LIPITOR) 10 MG tablet [Pharmacy Med Name: ATORVASTATIN 10 MG TABLET] 90 tablet 3    Sig: Take 1 tablet (10 mg total) by mouth daily at 6 PM.      Cardiovascular:  Antilipid - Statins Failed - 03/22/2020 12:18 PM      Failed - Total Cholesterol in normal range and within 360 days    Cholesterol, Total  Date Value Ref Range Status  06/02/2018 125 100 - 199 mg/dL Final   Cholesterol  Date Value Ref Range Status  12/12/2011 151 0 - 200 mg/dL Final   Cholesterol Piccolo, Waived  Date Value Ref Range Status  05/06/2015 123 <200 mg/dL Final    Comment:                            Desirable                <200                         Borderline High      200- 239                         High                     >239           Failed - LDL in normal range and within 360 days    Ldl Cholesterol, Calc  Date Value Ref Range Status  12/12/2011 112 (H) 0 - 100 mg/dL Final   LDL Calculated  Date Value Ref Range Status  06/02/2018 80 0 - 99 mg/dL Final          Failed - HDL in normal range and within 360 days    HDL Cholesterol  Date Value Ref Range Status  12/12/2011 31 (L) 40 - 60 mg/dL Final   HDL  Date Value Ref Range Status  06/02/2018 31 (L) >39 mg/dL Final          Failed - Triglycerides in normal range and within 360 days    Triglycerides  Date Value Ref Range Status  06/02/2018 69 0 - 149 mg/dL Final  69/62/9528 39 0 - 200 mg/dL Final   Triglycerides Piccolo,Waived  Date Value Ref Range Status  05/06/2015 85 <150 mg/dL Final    Comment:                            Normal                   <150                         Borderline High     150 - 199                         High                 200 - 499  Very High                >499           Passed - Patient is not pregnant      Passed - Valid encounter within last 12 months    Recent Outpatient Visits           5 months ago Focal epilepsy (HCC)   [facility] Cannady, Corrie Dandy T, NP   12 months ago Focal epilepsy (HCC)   [facility] [place], Dorie Rank, NP   1 year ago Focal epilepsy (HCC)   [facility] [place], Corrie Dandy T, NP   1 year ago Elevated alkaline phosphatase level   [facility] Gabriel Cirri, NP   1 year ago Annual physical exam   [facility] Gabriel Cirri, NP       Future Appointments             In 1 week Cannady, Dorie Rank, NP [facility]              famotidine (PEPCID) 40 MG tablet [Pharmacy Med Name: FAMOTIDINE 40 MG TABLET] 90 tablet 2    Sig: TAKE 1 TABLET BY MOUTH EVERY DAY IN THE EVENING      Gastroenterology:  H2 Antagonists Passed - 03/22/2020 12:18 PM      Passed - Valid encounter within last 12 months    Recent Outpatient Visits           5 months ago Focal epilepsy (HCC)   [facility] Cannady, Corrie Dandy T, NP   12 months ago Focal epilepsy (HCC)   [facility] [place], Morley T, NP   1 year ago Focal epilepsy (HCC)   [facility] [place], [place] T, NP   1 year ago Elevated alkaline phosphatase level   [facility] Gabriel Cirri, NP   1 year ago Annual physical exam   [facility] Gabriel Cirri, NP       Future Appointments             In 1 week Cannady, Dorie Rank, NP [facility]

## 2020-03-24 NOTE — Telephone Encounter (Signed)
Please find out if she has enough to get to her appointment with Jolene next week

## 2020-03-24 NOTE — Telephone Encounter (Signed)
Spoke with patient and states she does have enough medications to make to her appointment.

## 2020-04-04 ENCOUNTER — Encounter: Payer: Self-pay | Admitting: Nurse Practitioner

## 2020-04-04 ENCOUNTER — Ambulatory Visit: Payer: Medicare PPO | Admitting: Nurse Practitioner

## 2020-04-04 ENCOUNTER — Other Ambulatory Visit: Payer: Self-pay

## 2020-04-04 VITALS — BP 134/80 | HR 54 | Temp 97.8°F | Wt 139.8 lb

## 2020-04-04 DIAGNOSIS — I10 Essential (primary) hypertension: Secondary | ICD-10-CM

## 2020-04-04 DIAGNOSIS — G40109 Localization-related (focal) (partial) symptomatic epilepsy and epileptic syndromes with simple partial seizures, not intractable, without status epilepticus: Secondary | ICD-10-CM

## 2020-04-04 DIAGNOSIS — E7800 Pure hypercholesterolemia, unspecified: Secondary | ICD-10-CM | POA: Diagnosis not present

## 2020-04-04 MED ORDER — LEVETIRACETAM 500 MG PO TABS: 500.0000 mg | ORAL_TABLET | Freq: Two times a day (BID) | ORAL | 4 refills | Status: DC

## 2020-04-04 MED ORDER — TELMISARTAN 80 MG PO TABS: 80.0000 mg | ORAL_TABLET | Freq: Every day | ORAL | 4 refills | Status: DC

## 2020-04-04 MED ORDER — FAMOTIDINE 40 MG PO TABS: ORAL_TABLET | ORAL | 4 refills | Status: DC

## 2020-04-04 MED ORDER — ATORVASTATIN CALCIUM 10 MG PO TABS: 10.0000 mg | ORAL_TABLET | Freq: Every day | ORAL | 4 refills | Status: DC

## 2020-04-04 NOTE — Patient Instructions (Signed)
DASH Eating Plan DASH stands for "Dietary Approaches to Stop Hypertension." The DASH eating plan is a healthy eating plan that has been shown to reduce high blood pressure (hypertension). It may also reduce your risk for type 2 diabetes, heart disease, and stroke. The DASH eating plan may also help with weight loss. What are tips for following this plan?  General guidelines  Avoid eating more than 2,300 mg (milligrams) of salt (sodium) a day. If you have hypertension, you may need to reduce your sodium intake to 1,500 mg a day.  Limit alcohol intake to no more than 1 drink a day for nonpregnant women and 2 drinks a day for men. One drink equals 12 oz of beer, 5 oz of wine, or 1 oz of hard liquor.  Work with your health care provider to maintain a healthy body weight or to lose weight. Ask what an ideal weight is for you.  Get at least 30 minutes of exercise that causes your heart to beat faster (aerobic exercise) most days of the week. Activities may include walking, swimming, or biking.  Work with your health care provider or diet and nutrition specialist (dietitian) to adjust your eating plan to your individual calorie needs. Reading food labels   Check food labels for the amount of sodium per serving. Choose foods with less than 5 percent of the Daily Value of sodium. Generally, foods with less than 300 mg of sodium per serving fit into this eating plan.  To find whole grains, look for the word "whole" as the first word in the ingredient list. Shopping  Buy products labeled as "low-sodium" or "no salt added."  Buy fresh foods. Avoid canned foods and premade or frozen meals. Cooking  Avoid adding salt when cooking. Use salt-free seasonings or herbs instead of table salt or sea salt. Check with your health care provider or pharmacist before using salt substitutes.  Do not fry foods. Cook foods using healthy methods such as baking, boiling, grilling, and broiling instead.  Cook with  heart-healthy oils, such as olive, canola, soybean, or sunflower oil. Meal planning  Eat a balanced diet that includes: ? 5 or more servings of fruits and vegetables each day. At each meal, try to fill half of your plate with fruits and vegetables. ? Up to 6-8 servings of whole grains each day. ? Less than 6 oz of lean meat, poultry, or fish each day. A 3-oz serving of meat is about the same size as a deck of cards. One egg equals 1 oz. ? 2 servings of low-fat dairy each day. ? A serving of nuts, seeds, or beans 5 times each week. ? Heart-healthy fats. Healthy fats called Omega-3 fatty acids are found in foods such as flaxseeds and coldwater fish, like sardines, salmon, and mackerel.  Limit how much you eat of the following: ? Canned or prepackaged foods. ? Food that is high in trans fat, such as fried foods. ? Food that is high in saturated fat, such as fatty meat. ? Sweets, desserts, sugary drinks, and other foods with added sugar. ? Full-fat dairy products.  Do not salt foods before eating.  Try to eat at least 2 vegetarian meals each week.  Eat more home-cooked food and less restaurant, buffet, and fast food.  When eating at a restaurant, ask that your food be prepared with less salt or no salt, if possible. What foods are recommended? The items listed may not be a complete list. Talk with your dietitian about   what dietary choices are best for you. Grains Whole-grain or whole-wheat bread. Whole-grain or whole-wheat pasta. Brown rice. Oatmeal. Quinoa. Bulgur. Whole-grain and low-sodium cereals. Pita bread. Low-fat, low-sodium crackers. Whole-wheat flour tortillas. Vegetables Fresh or frozen vegetables (raw, steamed, roasted, or grilled). Low-sodium or reduced-sodium tomato and vegetable juice. Low-sodium or reduced-sodium tomato sauce and tomato paste. Low-sodium or reduced-sodium canned vegetables. Fruits All fresh, dried, or frozen fruit. Canned fruit in natural juice (without  added sugar). Meat and other protein foods Skinless chicken or turkey. Ground chicken or turkey. Pork with fat trimmed off. Fish and seafood. Egg whites. Dried beans, peas, or lentils. Unsalted nuts, nut butters, and seeds. Unsalted canned beans. Lean cuts of beef with fat trimmed off. Low-sodium, lean deli meat. Dairy Low-fat (1%) or fat-free (skim) milk. Fat-free, low-fat, or reduced-fat cheeses. Nonfat, low-sodium ricotta or cottage cheese. Low-fat or nonfat yogurt. Low-fat, low-sodium cheese. Fats and oils Soft margarine without trans fats. Vegetable oil. Low-fat, reduced-fat, or light mayonnaise and salad dressings (reduced-sodium). Canola, safflower, olive, soybean, and sunflower oils. Avocado. Seasoning and other foods Herbs. Spices. Seasoning mixes without salt. Unsalted popcorn and pretzels. Fat-free sweets. What foods are not recommended? The items listed may not be a complete list. Talk with your dietitian about what dietary choices are best for you. Grains Baked goods made with fat, such as croissants, muffins, or some breads. Dry pasta or rice meal packs. Vegetables Creamed or fried vegetables. Vegetables in a cheese sauce. Regular canned vegetables (not low-sodium or reduced-sodium). Regular canned tomato sauce and paste (not low-sodium or reduced-sodium). Regular tomato and vegetable juice (not low-sodium or reduced-sodium). Pickles. Olives. Fruits Canned fruit in a light or heavy syrup. Fried fruit. Fruit in cream or butter sauce. Meat and other protein foods Fatty cuts of meat. Ribs. Fried meat. Bacon. Sausage. Bologna and other processed lunch meats. Salami. Fatback. Hotdogs. Bratwurst. Salted nuts and seeds. Canned beans with added salt. Canned or smoked fish. Whole eggs or egg yolks. Chicken or turkey with skin. Dairy Whole or 2% milk, cream, and half-and-half. Whole or full-fat cream cheese. Whole-fat or sweetened yogurt. Full-fat cheese. Nondairy creamers. Whipped toppings.  Processed cheese and cheese spreads. Fats and oils Butter. Stick margarine. Lard. Shortening. Ghee. Bacon fat. Tropical oils, such as coconut, palm kernel, or palm oil. Seasoning and other foods Salted popcorn and pretzels. Onion salt, garlic salt, seasoned salt, table salt, and sea salt. Worcestershire sauce. Tartar sauce. Barbecue sauce. Teriyaki sauce. Soy sauce, including reduced-sodium. Steak sauce. Canned and packaged gravies. Fish sauce. Oyster sauce. Cocktail sauce. Horseradish that you find on the shelf. Ketchup. Mustard. Meat flavorings and tenderizers. Bouillon cubes. Hot sauce and Tabasco sauce. Premade or packaged marinades. Premade or packaged taco seasonings. Relishes. Regular salad dressings. Where to find more information:  National Heart, Lung, and Blood Institute: www.nhlbi.nih.gov  American Heart Association: www.heart.org Summary  The DASH eating plan is a healthy eating plan that has been shown to reduce high blood pressure (hypertension). It may also reduce your risk for type 2 diabetes, heart disease, and stroke.  With the DASH eating plan, you should limit salt (sodium) intake to 2,300 mg a day. If you have hypertension, you may need to reduce your sodium intake to 1,500 mg a day.  When on the DASH eating plan, aim to eat more fresh fruits and vegetables, whole grains, lean proteins, low-fat dairy, and heart-healthy fats.  Work with your health care provider or diet and nutrition specialist (dietitian) to adjust your eating plan to your   individual calorie needs. This information is not intended to replace advice given to you by your health care provider. Make sure you discuss any questions you have with your health care provider. Document Revised: 07/29/2017 Document Reviewed: 08/09/2016 Elsevier Patient Education  2020 Elsevier Inc.  

## 2020-04-04 NOTE — Assessment & Plan Note (Signed)
Chronic, ongoing.  Continue current medication regimen and adjust as needed. Lipid panel today. 

## 2020-04-04 NOTE — Progress Notes (Signed)
BP 134/80 (BP Location: Left Arm)   Pulse (!) 54   Temp 97.8 F (36.6 C) (Oral)   Wt 139 lb 12.8 oz (63.4 kg)   LMP  (LMP Unknown)   SpO2 98%   BMI 25.57 kg/m    Subjective:    Patient ID: Renee Lopez, female    DOB: 1941-04-15, 79 y.o.   MRN: [id number]  HPI: Renee Lopez is a 79 y.o. female  Chief Complaint  Patient presents with  . Hyperlipidemia  . Hypertension   HYPERTENSION / HYPERLIPIDEMIA Continues on ASA, Lipitor, and Micardis. Satisfied with current treatment?yes Duration of hypertension:chronic BP monitoring frequency:daily BP range:130/70's average -- 128/82 recent and HR in 60's BP medication side effects:no Duration of hyperlipidemia:chronic Cholesterol medication side effects:no Cholesterol supplements: none Medication compliance:good compliance Aspirin:yes Recent stressors:no Recurrent headaches:no Visual changes:no Palpitations:no Dyspnea:no Chest pain:no Lower extremity edema:no Dizzy/lightheaded:no  SEIZURE DISORDER: Continues on Keppra 500 MG BID. Last level November 2019 = 13.1. Recent LFT November 2019 noted WNL, including lipase and amylase. Denies abdominal pain, N&V, fever, SOB, or CP.Has not had a seizure in several years, "way back when I had to go to [facility]".  Only has history of 3 seizures.  Relevant past medical, surgical, family and social history reviewed and updated as indicated. Interim medical history since our last visit reviewed. Allergies and medications reviewed and updated.  Review of Systems  Constitutional: Negative for activity change, appetite change, diaphoresis, fatigue and fever.  Respiratory: Negative for cough, chest tightness and shortness of breath.   Cardiovascular: Negative for chest pain, palpitations and leg swelling.  Gastrointestinal: Negative.   Neurological: Negative for dizziness, seizures, syncope, weakness, light-headedness, numbness and headaches.   Psychiatric/Behavioral: Negative.     Per HPI unless specifically indicated above     Objective:    BP 134/80 (BP Location: Left Arm)   Pulse (!) 54   Temp 97.8 F (36.6 C) (Oral)   Wt 139 lb 12.8 oz (63.4 kg)   LMP  (LMP Unknown)   SpO2 98%   BMI 25.57 kg/m   Wt Readings from Last 3 Encounters:  04/04/20 139 lb 12.8 oz (63.4 kg)  10/01/19 137 lb (62.1 kg)  05/28/19 140 lb (63.5 kg)    Physical Exam Vitals and nursing note reviewed.  Constitutional:      General: She is awake. She is not in acute distress.    Appearance: She is well-developed and well-groomed. She is not ill-appearing.  HENT:     Head: Normocephalic.     Right Ear: Hearing normal.     Left Ear: Hearing normal.  Eyes:     General: Lids are normal.        Right eye: No discharge.        Left eye: No discharge.     Conjunctiva/sclera: Conjunctivae normal.     Pupils: Pupils are equal, round, and reactive to light.  Neck:     Thyroid: No thyromegaly.     Vascular: No carotid bruit.  Cardiovascular:     Rate and Rhythm: Normal rate and regular rhythm.     Heart sounds: Normal heart sounds. No murmur heard.  No gallop.   Pulmonary:     Effort: Pulmonary effort is normal. No accessory muscle usage or respiratory distress.     Breath sounds: Normal breath sounds.  Abdominal:     General: Bowel sounds are normal.     Palpations: Abdomen is soft.  Musculoskeletal:  Cervical back: Normal range of motion and neck supple.     Right lower leg: No edema.     Left lower leg: No edema.  Skin:    General: Skin is warm and dry.  Neurological:     Mental Status: She is alert and oriented to person, place, and time.  Psychiatric:        Attention and Perception: Attention normal.        Mood and Affect: Mood normal.        Speech: Speech normal.        Behavior: Behavior normal. Behavior is cooperative.        Thought Content: Thought content normal.     Results for orders placed or performed in  visit on 06/30/18  Gamma GT  Result Value Ref Range   GGT 33 0 - 60 IU/L  Amylase  Result Value Ref Range   Amylase 89 31 - 124 U/L  Lipase  Result Value Ref Range   Lipase 14 14 - 85 U/L  Comprehensive metabolic panel  Result Value Ref Range   Glucose 82 65 - 99 mg/dL   BUN 12 8 - 27 mg/dL   Creatinine, Ser 2.48 0.57 - 1.00 mg/dL   GFR calc non Af Amer 55 (L) >59 mL/min/1.73   GFR calc Af Amer 63 >59 mL/min/1.73   BUN/Creatinine Ratio 12 12 - 28   Sodium 144 134 - 144 mmol/L   Potassium 4.1 3.5 - 5.2 mmol/L   Chloride 107 (H) 96 - 106 mmol/L   CO2 22 20 - 29 mmol/L   Calcium 9.4 8.7 - 10.3 mg/dL   Total Protein 6.6 6.0 - 8.5 g/dL   Albumin 4.2 3.5 - 4.8 g/dL   Globulin, Total 2.4 1.5 - 4.5 g/dL   Albumin/Globulin Ratio 1.8 1.2 - 2.2   Bilirubin Total 0.9 0.0 - 1.2 mg/dL   Alkaline Phosphatase 117 39 - 117 IU/L   AST 19 0 - 40 IU/L   ALT 12 0 - 32 IU/L  Levetiracetam level  Result Value Ref Range   Levetiracetam Lvl 13.1 10.0 - 40.0 ug/mL  CBC with Differential/Platelet  Result Value Ref Range   WBC 3.8 3.4 - 10.8 x10E3/uL   RBC 4.62 3.77 - 5.28 x10E6/uL   Hemoglobin 13.2 11.1 - 15.9 g/dL   Hematocrit 25.0 03.7 - 46.6 %   MCV 88 79 - 97 fL   MCH 28.6 26.6 - 33.0 pg   MCHC 32.6 31 - 35 g/dL   RDW 04.8 88.9 - 16.9 %   Platelets 245 150 - 450 x10E3/uL   Neutrophils 57 Not Estab. %   Lymphs 27 Not Estab. %   Monocytes 11 Not Estab. %   Eos 4 Not Estab. %   Basos 1 Not Estab. %   Neutrophils Absolute 2.2 1 - 7 x10E3/uL   Lymphocytes Absolute 1.0 0 - 3 x10E3/uL   Monocytes Absolute 0.4 0 - 0 x10E3/uL   EOS (ABSOLUTE) 0.2 0.0 - 0.4 x10E3/uL   Basophils Absolute 0.0 0 - 0 x10E3/uL   Immature Granulocytes 0 Not Estab. %   Immature Grans (Abs) 0.0 0.0 - 0.1 x10E3/uL      Assessment & Plan:   Problem List Items Addressed This Visit      Cardiovascular and Mediastinum   Essential hypertension    Chronic, ongoing with BP at goal for age at home and on repeat in  office today.  Continue current medication regimen and adjust  as needed.  CMP today.  Recommend continued focus on DASH diet at home and regular BP checks.  Return in 6 months.      Relevant Medications   telmisartan (MICARDIS) 80 MG tablet   atorvastatin (LIPITOR) 10 MG tablet   Other Relevant Orders   Comprehensive metabolic panel     Nervous and Auditory   Focal epilepsy (HCC) - Primary    Chronic, stable on Keppra with no recent seizure activity. Keppra level and CMP today.      Relevant Medications   levETIRAcetam (KEPPRA) 500 MG tablet   Other Relevant Orders   Levetiracetam level     Other   Hyperlipidemia    Chronic, ongoing.  Continue current medication regimen and adjust as needed.  Lipid panel today.      Relevant Medications   telmisartan (MICARDIS) 80 MG tablet   atorvastatin (LIPITOR) 10 MG tablet   Other Relevant Orders   Comprehensive metabolic panel   Lipid Panel w/o Chol/HDL Ratio       Follow up plan: Return in about 6 months (around 10/05/2020) for HTN/HLD, Seizure, GERD.

## 2020-04-04 NOTE — Assessment & Plan Note (Signed)
Chronic, ongoing with BP at goal for age at home and on repeat in office today.  Continue current medication regimen and adjust as needed.  CMP today.  Recommend continued focus on DASH diet at home and regular BP checks.  Return in 6 months.

## 2020-04-04 NOTE — Assessment & Plan Note (Signed)
Chronic, stable on Keppra with no recent seizure activity. Keppra level and CMP today.

## 2020-04-07 LAB — COMPREHENSIVE METABOLIC PANEL
ALT: 19 IU/L (ref 0–32)
AST: 20 IU/L (ref 0–40)
Albumin/Globulin Ratio: 1.6 (ref 1.2–2.2)
Albumin: 4.1 g/dL (ref 3.7–4.7)
Alkaline Phosphatase: 155 IU/L — ABNORMAL HIGH (ref 48–121)
BUN/Creatinine Ratio: 17 (ref 12–28)
BUN: 13 mg/dL (ref 8–27)
Bilirubin Total: 0.9 mg/dL (ref 0.0–1.2)
CO2: 26 mmol/L (ref 20–29)
Calcium: 9.1 mg/dL (ref 8.7–10.3)
Chloride: 104 mmol/L (ref 96–106)
Creatinine, Ser: 0.78 mg/dL (ref 0.57–1.00)
GFR calc Af Amer: 83 mL/min/{1.73_m2} (ref >59)
GFR calc non Af Amer: 72 mL/min/{1.73_m2} (ref >59)
Globulin, Total: 2.6 g/dL (ref 1.5–4.5)
Glucose: 91 mg/dL (ref 65–99)
Potassium: 3.6 mmol/L (ref 3.5–5.2)
Sodium: 142 mmol/L (ref 134–144)
Total Protein: 6.7 g/dL (ref 6.0–8.5)

## 2020-04-07 LAB — LIPID PANEL W/O CHOL/HDL RATIO
Cholesterol, Total: 125 mg/dL (ref 100–199)
HDL: 36 mg/dL — ABNORMAL LOW (ref >39)
LDL Chol Calc (NIH): 74 mg/dL (ref 0–99)
Triglycerides: 72 mg/dL (ref 0–149)
VLDL Cholesterol Cal: 15 mg/dL (ref 5–40)

## 2020-04-07 LAB — LEVETIRACETAM LEVEL: Levetiracetam Lvl: 26.7 ug/mL (ref 10.0–40.0)

## 2020-06-02 ENCOUNTER — Ambulatory Visit (INDEPENDENT_AMBULATORY_CARE_PROVIDER_SITE_OTHER): Payer: Medicare PPO

## 2020-06-02 VITALS — BP 124/82 | HR 64 | Temp 98.4°F | Ht 62.0 in | Wt 140.0 lb

## 2020-06-02 DIAGNOSIS — Z0000 Encounter for general adult medical examination without abnormal findings: Secondary | ICD-10-CM

## 2020-06-02 NOTE — Patient Instructions (Signed)
Renee Lopez , Thank you for taking time to come for your Medicare Wellness Visit. I appreciate your ongoing commitment to your health goals. Please review the following plan we discussed and let me know if I can assist you in the future.   Screening recommendations/referrals: Colonoscopy: not required Mammogram: not required Bone Density: never Recommended yearly ophthalmology/optometry visit for glaucoma screening and checkup Recommended yearly dental visit for hygiene and checkup  Vaccinations: Influenza vaccine: due Pneumococcal vaccine: completed 05/19/2016 Tdap vaccine: completed 01/07/2014 Shingles vaccine: discussed   Covid-19: 12/11/2019, 11/15/2019  Advanced directives: copy in chart  Conditions/risks identified: none  Next appointment: Follow up in one year for your annual wellness visit    Preventive Care 65 Years and Older, Female Preventive care refers to lifestyle choices and visits with your health care provider that can promote health and wellness. What does preventive care include?  A yearly physical exam. This is also called an annual well check.  Dental exams once or twice a year.  Routine eye exams. Ask your health care provider how often you should have your eyes checked.  Personal lifestyle choices, including:  Daily care of your teeth and gums.  Regular physical activity.  Eating a healthy diet.  Avoiding tobacco and drug use.  Limiting alcohol use.  Practicing safe sex.  Taking low-dose aspirin every day.  Taking vitamin and mineral supplements as recommended by your health care provider. What happens during an annual well check? The services and screenings done by your health care provider during your annual well check will depend on your age, overall health, lifestyle risk factors, and family history of disease. Counseling  Your health care provider may ask you questions about your:  Alcohol use.  Tobacco use.  Drug  use.  Emotional well-being.  Home and relationship well-being.  Sexual activity.  Eating habits.  History of falls.  Memory and ability to understand (cognition).  Work and work Astronomer.  Reproductive health. Screening  You may have the following tests or measurements:  Height, weight, and BMI.  Blood pressure.  Lipid and cholesterol levels. These may be checked every 5 years, or more frequently if you are over 53 years old.  Skin check.  Lung cancer screening. You may have this screening every year starting at age 31 if you have a 30-pack-year history of smoking and currently smoke or have quit within the past 15 years.  Fecal occult blood test (FOBT) of the stool. You may have this test every year starting at age 68.  Flexible sigmoidoscopy or colonoscopy. You may have a sigmoidoscopy every 5 years or a colonoscopy every 10 years starting at age 64.  Hepatitis C blood test.  Hepatitis B blood test.  Sexually transmitted disease (STD) testing.  Diabetes screening. This is done by checking your blood sugar (glucose) after you have not eaten for a while (fasting). You may have this done every 1-3 years.  Bone density scan. This is done to screen for osteoporosis. You may have this done starting at age 69.  Mammogram. This may be done every 1-2 years. Talk to your health care provider about how often you should have regular mammograms. Talk with your health care provider about your test results, treatment options, and if necessary, the need for more tests. Vaccines  Your health care provider may recommend certain vaccines, such as:  Influenza vaccine. This is recommended every year.  Tetanus, diphtheria, and acellular pertussis (Tdap, Td) vaccine. You may need a Td booster every  10 years.  Zoster vaccine. You may need this after age 45.  Pneumococcal 13-valent conjugate (PCV13) vaccine. One dose is recommended after age 74.  Pneumococcal polysaccharide  (PPSV23) vaccine. One dose is recommended after age 109. Talk to your health care provider about which screenings and vaccines you need and how often you need them. This information is not intended to replace advice given to you by your health care provider. Make sure you discuss any questions you have with your health care provider. Document Released: 09/12/2015 Document Revised: 05/05/2016 Document Reviewed: 06/17/2015 Elsevier Interactive Patient Education  2017 [place] Prevention in the Home Falls can cause injuries. They can happen to people of all ages. There are many things you can do to make your home safe and to help prevent falls. What can I do on the outside of my home?  Regularly fix the edges of walkways and driveways and fix any cracks.  Remove anything that might make you trip as you walk through a door, such as a raised step or threshold.  Trim any bushes or trees on the path to your home.  Use bright outdoor lighting.  Clear any walking paths of anything that might make someone trip, such as rocks or tools.  Regularly check to see if handrails are loose or broken. Make sure that both sides of any steps have handrails.  Any raised decks and porches should have guardrails on the edges.  Have any leaves, snow, or ice cleared regularly.  Use sand or salt on walking paths during winter.  Clean up any spills in your garage right away. This includes oil or grease spills. What can I do in the bathroom?  Use night lights.  Install grab bars by the toilet and in the tub and shower. Do not use towel bars as grab bars.  Use non-skid mats or decals in the tub or shower.  If you need to sit down in the shower, use a plastic, non-slip stool.  Keep the floor dry. Clean up any water that spills on the floor as soon as it happens.  Remove soap buildup in the tub or shower regularly.  Attach bath mats securely with double-sided non-slip rug tape.  Do not have  throw rugs and other things on the floor that can make you trip. What can I do in the bedroom?  Use night lights.  Make sure that you have a light by your bed that is easy to reach.  Do not use any sheets or blankets that are too big for your bed. They should not hang down onto the floor.  Have a firm chair that has side arms. You can use this for support while you get dressed.  Do not have throw rugs and other things on the floor that can make you trip. What can I do in the kitchen?  Clean up any spills right away.  Avoid walking on wet floors.  Keep items that you use a lot in easy-to-reach places.  If you need to reach something above you, use a strong step stool that has a grab bar.  Keep electrical cords out of the way.  Do not use floor polish or wax that makes floors slippery. If you must use wax, use non-skid floor wax.  Do not have throw rugs and other things on the floor that can make you trip. What can I do with my stairs?  Do not leave any items on the stairs.  Make sure  that there are handrails on both sides of the stairs and use them. Fix handrails that are broken or loose. Make sure that handrails are as long as the stairways.  Check any carpeting to make sure that it is firmly attached to the stairs. Fix any carpet that is loose or worn.  Avoid having throw rugs at the top or bottom of the stairs. If you do have throw rugs, attach them to the floor with carpet tape.  Make sure that you have a light switch at the top of the stairs and the bottom of the stairs. If you do not have them, ask someone to add them for you. What else can I do to help prevent falls?  Wear shoes that:  Do not have high heels.  Have rubber bottoms.  Are comfortable and fit you well.  Are closed at the toe. Do not wear sandals.  If you use a stepladder:  Make sure that it is fully opened. Do not climb a closed stepladder.  Make sure that both sides of the stepladder are  locked into place.  Ask someone to hold it for you, if possible.  Clearly mark and make sure that you can see:  Any grab bars or handrails.  First and last steps.  Where the edge of each step is.  Use tools that help you move around (mobility aids) if they are needed. These include:  Canes.  Walkers.  Scooters.  Crutches.  Turn on the lights when you go into a dark area. Replace any light bulbs as soon as they burn out.  Set up your furniture so you have a clear path. Avoid moving your furniture around.  If any of your floors are uneven, fix them.  If there are any pets around you, be aware of where they are.  Review your medicines with your doctor. Some medicines can make you feel dizzy. This can increase your chance of falling. Ask your doctor what other things that you can do to help prevent falls. This information is not intended to replace advice given to you by your health care provider. Make sure you discuss any questions you have with your health care provider. Document Released: 06/12/2009 Document Revised: 01/22/2016 Document Reviewed: 09/20/2014 Elsevier Interactive Patient Education  2017 [employer].

## 2020-06-02 NOTE — Progress Notes (Signed)
I connected with Renee Lopez today by telephone and verified that I am speaking with the correct person using two identifiers. Location patient: home Location provider: work Persons participating in the virtual visit: Phyillis Dascoli, [name] LPN.   I discussed the limitations, risks, security and privacy concerns of performing an evaluation and management service by telephone and the availability of in person appointments. I also discussed with the patient that there may be a patient responsible charge related to this service. The patient expressed understanding and verbally consented to this telephonic visit.    Interactive audio and video telecommunications were attempted between this provider and patient, however failed, due to patient having technical difficulties OR patient did not have access to video capability.  We continued and completed visit with audio only.     Vital signs may be patient reported or missing.  Subjective:   Renee Lopez is a 79 y.o. female who presents for Medicare Annual (Subsequent) preventive examination.  Review of Systems     Cardiac Risk Factors include: advanced age (>20men, >80 women);hypertension     Objective:    Today's Vitals   06/02/20 0942  BP: 124/82  Pulse: 64  Temp: 98.4 F (36.9 C)  Weight: 140 lb (63.5 kg)  Height: 5\' 2"  (1.575 m)   Body mass index is 25.61 kg/m.  Advanced Directives 06/02/2020 05/28/2019 05/26/2018 05/04/2017 05/19/2016  Does Patient Have a Medical Advance Directive? Yes Yes Yes Yes Yes  Type of 05/21/2016 of [place];Living will Living will;Healthcare Power of Attorney Living will;Healthcare Power of [place] Power of [place];Living will Healthcare Power of [place];Living will  Copy of Healthcare Power of Attorney in Chart? Yes - validated most recent copy scanned in chart (See row information) No - copy requested No - copy requested No - copy requested -      Current Medications (verified) Outpatient Encounter Medications as of 06/02/2020  Medication Sig  . aspirin 81 MG tablet Take 81 mg by mouth daily.  08/02/2020 atorvastatin (LIPITOR) 10 MG tablet Take 1 tablet (10 mg total) by mouth daily at 6 PM.  . famotidine (PEPCID) 40 MG tablet TAKE 1 TABLET BY MOUTH EVERY DAY IN THE EVENING  . levETIRAcetam (KEPPRA) 500 MG tablet Take 1 tablet (500 mg total) by mouth 2 (two) times daily.  Marland Kitchen telmisartan (MICARDIS) 80 MG tablet Take 1 tablet (80 mg total) by mouth daily.   No facility-administered encounter medications on file as of 06/02/2020.    Allergies (verified) Patient has no known allergies.   History: Past Medical History:  Diagnosis Date  . Arthritis   . Hyperlipidemia   . Hypertension   . Seizures (HCC)    Past Surgical History:  Procedure Laterality Date  . CHOLECYSTECTOMY     Family History  Problem Relation Age of Onset  . Seizures Mother   . Hyperlipidemia Mother   . Hypertension Mother   . Cancer Father        colon  . Hypertension Father    Social History   Socioeconomic History  . Marital status: Married    Spouse name: Not on file  . Number of children: Not on file  . Years of education: Not on file  . Highest education level: High school graduate  Occupational History  . Occupation: retired  Tobacco Use  . Smoking status: Never Smoker  . Smokeless tobacco: Never Used  Vaping Use  . Vaping Use: Never used  Substance and Sexual Activity  .  Alcohol use: No    Alcohol/week: 0.0 standard drinks  . Drug use: No  . Sexual activity: Yes  Other Topics Concern  . Not on file  Social History Narrative  . Not on file   Social Determinants of Health   Financial Resource Strain: Low Risk   . Difficulty of Paying Living Expenses: Not hard at all  Food Insecurity: No Food Insecurity  . Worried About Programme researcher, broadcasting/film/video in the Last Year: Never true  . Ran Out of Food in the Last Year: Never true  Transportation  Needs: No Transportation Needs  . Lack of Transportation (Medical): No  . Lack of Transportation (Non-Medical): No  Physical Activity: Inactive  . Days of Exercise per Week: 0 days  . Minutes of Exercise per Session: 0 min  Stress: No Stress Concern Present  . Feeling of Stress : Not at all  Social Connections:   . Frequency of Communication with Friends and Family: Not on file  . Frequency of Social Gatherings with Friends and Family: Not on file  . Attends Religious Services: Not on file  . Active Member of Clubs or Organizations: Not on file  . Attends Banker Meetings: Not on file  . Marital Status: Not on file    Tobacco Counseling Counseling given: Not Answered   Clinical Intake:  Pre-visit preparation completed: Yes  Pain : No/denies pain     Nutritional Status: BMI 25 -29 Overweight Nutritional Risks: None Diabetes: No  How often do you need to have someone help you when you read instructions, pamphlets, or other written materials from your doctor or pharmacy?: 1 - Never What is the last grade level you completed in school?: 12th grade  Diabetic? no  Interpreter Needed?: No  Information entered by :: NAllen LPN   Activities of Daily Living In your present state of health, do you have any difficulty performing the following activities: 06/02/2020  Hearing? N  Vision? N  Difficulty concentrating or making decisions? N  Walking or climbing stairs? N  Dressing or bathing? N  Doing errands, shopping? N  Preparing Food and eating ? N  Using the Toilet? N  In the past six months, have you accidently leaked urine? N  Do you have problems with loss of bowel control? N  Managing your Medications? N  Managing your Finances? N  Housekeeping or managing your Housekeeping? N  Some recent data might be hidden    Patient Care Team: Marjie Skiff, NP as PCP - General (Nurse Practitioner)  Indicate any recent Medical Services you may have received  from other than [facility] providers in the past year (date may be approximate).     Assessment:   This is a routine wellness examination for [place].  Hearing/Vision screen  Hearing Screening   125Hz  250Hz  500Hz  1000Hz  2000Hz  3000Hz  4000Hz  6000Hz  8000Hz   Right ear:           Left ear:           Vision Screening Comments: Regular eye exams, [facility]  Dietary issues and exercise activities discussed: Current Exercise Habits: The patient does not participate in regular exercise at present ([place])  Goals    . DIET - INCREASE WATER INTAKE     Recommend drinking at least 6-8 glasses of water a day     . Patient Stated     06/02/2020, decrease soda and juice intake      Depression Screen PHQ  2/9 Scores 06/02/2020 05/28/2019 05/26/2018 05/04/2017 05/19/2016  PHQ - 2 Score 0 0 0 0 0  PHQ- 9 Score - - - - 0    Fall Risk Fall Risk  06/02/2020 04/04/2020 05/28/2019 05/26/2018 05/04/2017  Falls in the past year? 0 0 0 No No  Number falls in past yr: - 0 0 - -  Injury with Fall? - 0 0 - -  Risk for fall due to : Medication side effect No Fall Risks - - -  Follow up Falls evaluation completed;Education provided;Falls prevention discussed Falls evaluation completed - - -    Any stairs in or around the home? Yes  If so, are there any without handrails? Yes  Home free of loose throw rugs in walkways, pet beds, electrical cords, etc? Yes  Adequate lighting in your home to reduce risk of falls? Yes   ASSISTIVE DEVICES UTILIZED TO PREVENT FALLS:  Life alert? No  Use of a cane, walker or w/c? No  Grab bars in the bathroom? No  Shower chair or bench in shower? No  Elevated toilet seat or a handicapped toilet? No   TIMED UP AND GO:  Was the test performed? No . .    Cognitive Function:     6CIT Screen 06/02/2020 05/26/2018 05/04/2017  What Year? 0 points 0 points 0 points  What month? 0 points 0 points 0 points  What time? 0 points 0 points 0 points  Count back from 20 0 points 0  points 0 points  Months in reverse 0 points 0 points 0 points  Repeat phrase 0 points 2 points 2 points  Total Score 0 2 2    Immunizations Immunization History  Administered Date(s) Administered  . Influenza, High Dose Seasonal PF 06/24/2017, 06/07/2018, 06/19/2019  . Influenza-Unspecified 06/09/2015, 06/10/2016, 06/21/2017  . PFIZER SARS-COV-2 Vaccination 11/15/2019, 12/11/2019  . Pneumococcal Conjugate-13 05/10/2014  . Pneumococcal Polysaccharide-23 05/19/2016  . Td 01/07/2014    TDAP status: Up to date Flu Vaccine status: Up to date Pneumococcal vaccine status: Up to date Covid-19 vaccine status: Completed vaccines  Qualifies for Shingles Vaccine? Yes   Zostavax completed No   Shingrix Completed?: No.    Education has been provided regarding the importance of this vaccine. Patient has been advised to call insurance company to determine out of pocket expense if they have not yet received this vaccine. Advised may also receive vaccine at local pharmacy or Health Dept. Verbalized acceptance and understanding.  Screening Tests Health Maintenance  Topic Date Due  . INFLUENZA VACCINE  03/30/2020  . TETANUS/TDAP  01/08/2024  . COVID-19 Vaccine  Completed  . PNA vac Low Risk Adult  Completed  . DEXA SCAN  Discontinued    Health Maintenance  Health Maintenance Due  Topic Date Due  . INFLUENZA VACCINE  03/30/2020    Colorectal cancer screening: No longer required.  Mammogram status: No longer required.  Bone Density status: never  Lung Cancer Screening: (Low Dose CT Chest recommended if Age 78-80 years, 30 pack-year currently smoking OR have quit w/in 15years.) does not qualify.   Lung Cancer Screening Referral: no  Additional Screening:  Hepatitis C Screening: does not qualify;   Vision Screening: Recommended annual ophthalmology exams for early detection of glaucoma and other disorders of the eye. Is the patient up to date with their annual eye exam?  Yes  Who is  the provider or what is the name of the office in which the patient attends annual eye exams? [facility] If pt is not established with a provider, would they like to be referred to a provider to establish care? No .   Dental Screening: Recommended annual dental exams for proper oral hygiene  Community Resource Referral / Chronic Care Management: CRR required this visit?  No   CCM required this visit?  No      Plan:     I have personally reviewed and noted the following in the patient's chart:   . Medical and social history . Use of alcohol, tobacco or illicit drugs  . Current medications and supplements . Functional ability and status . Nutritional status . Physical activity . Advanced directives . List of other physicians . Hospitalizations, surgeries, and ER visits in previous 12 months . Vitals . Screenings to include cognitive, depression, and falls . Referrals and appointments  In addition, I have reviewed and discussed with patient certain preventive protocols, quality metrics, and best practice recommendations. A written personalized care plan for preventive services as well as general preventive health recommendations were provided to patient.     Barb Merino, LPN   05/07/1190   Nurse Notes:

## 2020-09-11 ENCOUNTER — Other Ambulatory Visit: Payer: Self-pay

## 2020-09-11 ENCOUNTER — Encounter: Payer: Self-pay | Admitting: Ophthalmology

## 2020-09-11 NOTE — Discharge Instructions (Signed)

## 2020-09-12 ENCOUNTER — Other Ambulatory Visit
Admission: RE | Admit: 2020-09-12 | Discharge: 2020-09-12 | Disposition: A | Discharge status: Home / Self Care | Payer: Medicare PPO | Source: Ambulatory Visit | Attending: Ophthalmology | Admitting: Ophthalmology

## 2020-09-12 DIAGNOSIS — Z20822 Contact with and (suspected) exposure to covid-19: Secondary | ICD-10-CM | POA: Insufficient documentation

## 2020-09-12 DIAGNOSIS — Z01812 Encounter for preprocedural laboratory examination: Secondary | ICD-10-CM | POA: Diagnosis present

## 2020-09-12 LAB — SARS CORONAVIRUS 2 (TAT 6-24 HRS): SARS Coronavirus 2: NEGATIVE

## 2020-09-16 ENCOUNTER — Ambulatory Visit
Admission: RE | Admit: 2020-09-16 | Discharge: 2020-09-16 | Disposition: A | Discharge status: Home / Self Care | Payer: Medicare PPO | Attending: Ophthalmology | Admitting: Ophthalmology

## 2020-09-16 ENCOUNTER — Ambulatory Visit: Payer: Medicare PPO | Admitting: Anesthesiology

## 2020-09-16 ENCOUNTER — Encounter
Admission: RE | Disposition: A | Discharge status: Home / Self Care | Payer: Self-pay | Source: Home / Self Care | Attending: Ophthalmology

## 2020-09-16 ENCOUNTER — Encounter: Payer: Self-pay | Admitting: Ophthalmology

## 2020-09-16 ENCOUNTER — Other Ambulatory Visit: Payer: Self-pay

## 2020-09-16 DIAGNOSIS — Z79899 Other long term (current) drug therapy: Secondary | ICD-10-CM | POA: Diagnosis not present

## 2020-09-16 DIAGNOSIS — H2511 Age-related nuclear cataract, right eye: Secondary | ICD-10-CM | POA: Diagnosis present

## 2020-09-16 DIAGNOSIS — Z7982 Long term (current) use of aspirin: Secondary | ICD-10-CM | POA: Diagnosis not present

## 2020-09-16 HISTORY — DX: Presence of dental prosthetic device (complete) (partial): Z97.2

## 2020-09-16 HISTORY — PX: CATARACT EXTRACTION W/PHACO: SHX586

## 2020-09-16 SURGERY — PHACOEMULSIFICATION, CATARACT, WITH IOL INSERTION
Anesthesia: Monitor Anesthesia Care | Site: Eye | Laterality: Right

## 2020-09-16 MED ORDER — EPINEPHRINE PF 1 MG/ML IJ SOLN
INTRAOCULAR | Status: DC | PRN
  Administered 2020-09-16: 58 mL via OPHTHALMIC

## 2020-09-16 MED ORDER — BRIMONIDINE TARTRATE-TIMOLOL 0.2-0.5 % OP SOLN
OPHTHALMIC | Status: DC | PRN
  Administered 2020-09-16: 1 [drp] via OPHTHALMIC

## 2020-09-16 MED ORDER — LIDOCAINE HCL (PF) 2 % IJ SOLN
INTRAOCULAR | Status: DC | PRN
  Administered 2020-09-16: 1 mL

## 2020-09-16 MED ORDER — CEFUROXIME OPHTHALMIC INJECTION 1 MG/0.1 ML
INJECTION | OPHTHALMIC | Status: DC | PRN
  Administered 2020-09-16: 0.1 mL via INTRACAMERAL

## 2020-09-16 MED ORDER — MIDAZOLAM HCL 2 MG/2ML IJ SOLN
INTRAMUSCULAR | Status: DC | PRN
  Administered 2020-09-16 (×2): 1 mg via INTRAVENOUS

## 2020-09-16 MED ORDER — FENTANYL CITRATE (PF) 100 MCG/2ML IJ SOLN
INTRAMUSCULAR | Status: DC | PRN
  Administered 2020-09-16: 50 ug via INTRAVENOUS

## 2020-09-16 MED ORDER — ARMC OPHTHALMIC DILATING DROPS
1.0000 "application " | OPHTHALMIC | Status: DC | PRN
  Administered 2020-09-16 (×3): 1 via OPHTHALMIC

## 2020-09-16 MED ORDER — LACTATED RINGERS IV SOLN: INTRAVENOUS | Status: DC

## 2020-09-16 MED ORDER — NA HYALUR & NA CHOND-NA HYALUR 0.4-0.35 ML IO KIT
PACK | INTRAOCULAR | Status: DC | PRN
  Administered 2020-09-16: 1 mL via INTRAOCULAR

## 2020-09-16 MED ORDER — TETRACAINE HCL 0.5 % OP SOLN
1.0000 [drp] | OPHTHALMIC | Status: DC | PRN
  Administered 2020-09-16 (×3): 1 [drp] via OPHTHALMIC

## 2020-09-16 SURGICAL SUPPLY — 22 items
CANNULA ANT/CHMB 27G (MISCELLANEOUS) ×1 IMPLANT
CANNULA ANT/CHMB 27GA (MISCELLANEOUS) ×2 IMPLANT
GLOVE SURG LX 7.5 STRW (GLOVE) ×1
GLOVE SURG LX STRL 7.5 STRW (GLOVE) ×1 IMPLANT
GLOVE SURG TRIUMPH 8.0 PF LTX (GLOVE) ×2 IMPLANT
GOWN STRL REUS W/ TWL LRG LVL3 (GOWN DISPOSABLE) ×2 IMPLANT
GOWN STRL REUS W/TWL LRG LVL3 (GOWN DISPOSABLE) ×4
LENS IOL TECNIS EYHANCE 23.0 (Intraocular Lens) ×1 IMPLANT
MARKER SKIN DUAL TIP RULER LAB (MISCELLANEOUS) ×2 IMPLANT
NDL CAPSULORHEX 25GA (NEEDLE) ×1 IMPLANT
NDL FILTER BLUNT 18X1 1/2 (NEEDLE) ×2 IMPLANT
NEEDLE CAPSULORHEX 25GA (NEEDLE) ×2 IMPLANT
NEEDLE FILTER BLUNT 18X 1/2SAF (NEEDLE) ×2
NEEDLE FILTER BLUNT 18X1 1/2 (NEEDLE) ×2 IMPLANT
PACK CATARACT BRASINGTON (MISCELLANEOUS) ×2 IMPLANT
PACK EYE AFTER SURG (MISCELLANEOUS) ×2 IMPLANT
PACK OPTHALMIC (MISCELLANEOUS) ×2 IMPLANT
SOLUTION OPHTHALMIC SALT (MISCELLANEOUS) ×2 IMPLANT
SYR 3ML LL SCALE MARK (SYRINGE) ×4 IMPLANT
SYR TB 1ML LUER SLIP (SYRINGE) ×2 IMPLANT
WATER STERILE IRR 250ML POUR (IV SOLUTION) ×2 IMPLANT
WIPE NON LINTING 3.25X3.25 (MISCELLANEOUS) ×2 IMPLANT

## 2020-09-16 NOTE — Anesthesia Preprocedure Evaluation (Signed)
Anesthesia Evaluation  Patient identified by MRN, date of birth, ID band Patient awake    Reviewed: Allergy & Precautions, H&P , NPO status , Patient's Chart, lab work & pertinent test results  Airway Mallampati: II  TM Distance: >3 FB Neck ROM: full    Dental no notable dental hx.    Pulmonary neg pulmonary ROS,    Pulmonary exam normal        Cardiovascular hypertension, On Medications Normal cardiovascular exam     Neuro/Psych Well Controlled,     GI/Hepatic negative GI ROS, Neg liver ROS,   Endo/Other  negative endocrine ROS  Renal/GU negative Renal ROS  negative genitourinary   Musculoskeletal   Abdominal   Peds  Hematology negative hematology ROS (+)   Anesthesia Other Findings   Reproductive/Obstetrics negative OB ROS                             Anesthesia Physical Anesthesia Plan  ASA: II  Anesthesia Plan: MAC   Post-op Pain Management:    Induction:   PONV Risk Score and Plan: 2 and Treatment may vary due to age or medical condition  Airway Management Planned:   Additional Equipment:   Intra-op Plan:   Post-operative Plan:   Informed Consent: I have reviewed the patients History and Physical, chart, labs and discussed the procedure including the risks, benefits and alternatives for the proposed anesthesia with the patient or authorized representative who has indicated his/her understanding and acceptance.       Plan Discussed with:   Anesthesia Plan Comments:         Anesthesia Quick Evaluation

## 2020-09-16 NOTE — Anesthesia Procedure Notes (Signed)
Procedure Name: MAC Date/Time: 09/16/2020 11:16 AM Performed by: Jeannene Patella, CRNA Pre-anesthesia Checklist: Patient identified, Emergency Drugs available, Suction available, Timeout performed and Patient being monitored Patient Re-evaluated:Patient Re-evaluated prior to induction Oxygen Delivery Method: Nasal cannula Placement Confirmation: positive ETCO2

## 2020-09-16 NOTE — Transfer of Care (Signed)
Immediate Anesthesia Transfer of Care Note  Patient: Renee Lopez  Procedure(s) Performed: CATARACT EXTRACTION PHACO AND INTRAOCULAR LENS PLACEMENT (IOC) RIGHT 10.65 01:21.5 13.1% (Right Eye)  Patient Location: PACU  Anesthesia Type: MAC  Level of Consciousness: awake, alert  and patient cooperative  Airway and Oxygen Therapy: Patient Spontanous Breathing and Patient connected to supplemental oxygen  Post-op Assessment: Post-op Vital signs reviewed, Patient's Cardiovascular Status Stable, Respiratory Function Stable, Patent Airway and No signs of Nausea or vomiting  Post-op Vital Signs: Reviewed and stable  Complications: No complications documented.

## 2020-09-16 NOTE — H&P (Signed)

## 2020-09-16 NOTE — Op Note (Signed)
LOCATION:  [facility]   PREOPERATIVE DIAGNOSIS:    Nuclear sclerotic cataract right eye. H25.11   POSTOPERATIVE DIAGNOSIS:  Nuclear sclerotic cataract right eye.     PROCEDURE:  Phacoemusification with posterior chamber intraocular lens placement of the right eye   ULTRASOUND TIME: Procedure(s): CATARACT EXTRACTION PHACO AND INTRAOCULAR LENS PLACEMENT (IOC) RIGHT 10.65 01:21.5 13.1% (Right)  LENS:   Implant Name Type Inv. Item Serial No. Manufacturer Lot No. LRB No. Used Action  LENS IOL TECNIS EYHANCE 23.0 - [id number] Intraocular Lens LENS IOL TECNIS EYHANCE 23.0 [id number] JOHNSON   Right 1 Implanted         SURGEON:  Deirdre Evener, MD   ANESTHESIA:  Topical with tetracaine drops and 2% Xylocaine jelly, augmented with 1% preservative-free intracameral lidocaine.    COMPLICATIONS:  None.   DESCRIPTION OF PROCEDURE:  The patient was identified in the holding room and transported to the operating room and placed in the supine position under the operating microscope.  The right eye was identified as the operative eye and it was prepped and draped in the usual sterile ophthalmic fashion.   A 1 millimeter clear-corneal paracentesis was made at the 12:00 position.  0.5 ml of preservative-free 1% lidocaine was injected into the anterior chamber. The anterior chamber was filled with Viscoat viscoelastic.  A 2.4 millimeter keratome was used to make a near-clear corneal incision at the 9:00 position.  A curvilinear capsulorrhexis was made with a cystotome and capsulorrhexis forceps.  Balanced salt solution was used to hydrodissect and hydrodelineate the nucleus.   Phacoemulsification was then used in stop and chop fashion to remove the lens nucleus and epinucleus.  The remaining cortex was then removed using the irrigation and aspiration handpiece. Provisc was then placed into the capsular bag to distend it for lens placement.  A lens was then injected into the capsular bag.   The remaining viscoelastic was aspirated.   Wounds were hydrated with balanced salt solution.  The anterior chamber was inflated to a physiologic pressure with balanced salt solution.  No wound leaks were noted. Cefuroxime 0.1 ml of a 10mg /ml solution was injected into the anterior chamber for a dose of 1 mg of intracameral antibiotic at the completion of the case.   Timolol and Brimonidine drops were applied to the eye.  The patient was taken to the recovery room in stable condition without complications of anesthesia or surgery.   Sophiya Morello 09/16/2020, 11:33 AM

## 2020-09-16 NOTE — Anesthesia Postprocedure Evaluation (Signed)
Anesthesia Post Note  Patient: Renee Lopez  Procedure(s) Performed: CATARACT EXTRACTION PHACO AND INTRAOCULAR LENS PLACEMENT (IOC) RIGHT 10.65 01:21.5 13.1% (Right Eye)     Patient location during evaluation: PACU Anesthesia Type: MAC Level of consciousness: awake and alert Pain management: pain level controlled Vital Signs Assessment: post-procedure vital signs reviewed and stable Respiratory status: spontaneous breathing Cardiovascular status: stable Anesthetic complications: no   No complications documented.  Gillian Scarce

## 2020-09-17 ENCOUNTER — Encounter: Payer: Self-pay | Admitting: Ophthalmology

## 2020-09-29 ENCOUNTER — Other Ambulatory Visit: Payer: Self-pay

## 2020-09-29 ENCOUNTER — Encounter: Payer: Self-pay | Admitting: Ophthalmology

## 2020-10-06 ENCOUNTER — Other Ambulatory Visit: Payer: Self-pay

## 2020-10-06 ENCOUNTER — Other Ambulatory Visit
Admission: RE | Admit: 2020-10-06 | Discharge: 2020-10-06 | Disposition: A | Discharge status: Home / Self Care | Payer: Medicare PPO | Source: Ambulatory Visit | Attending: Ophthalmology | Admitting: Ophthalmology

## 2020-10-06 DIAGNOSIS — Z20822 Contact with and (suspected) exposure to covid-19: Secondary | ICD-10-CM | POA: Insufficient documentation

## 2020-10-06 DIAGNOSIS — Z01812 Encounter for preprocedural laboratory examination: Secondary | ICD-10-CM | POA: Insufficient documentation

## 2020-10-06 NOTE — Discharge Instructions (Signed)

## 2020-10-07 ENCOUNTER — Ambulatory Visit: Payer: Medicare PPO | Admitting: Nurse Practitioner

## 2020-10-07 LAB — SARS CORONAVIRUS 2 (TAT 6-24 HRS): SARS Coronavirus 2: NEGATIVE

## 2020-10-08 ENCOUNTER — Encounter: Payer: Self-pay | Admitting: Ophthalmology

## 2020-10-08 ENCOUNTER — Ambulatory Visit: Payer: Medicare PPO | Admitting: Anesthesiology

## 2020-10-08 ENCOUNTER — Ambulatory Visit
Admission: RE | Admit: 2020-10-08 | Discharge: 2020-10-08 | Disposition: A | Discharge status: Home / Self Care | Payer: Medicare PPO | Attending: Ophthalmology | Admitting: Ophthalmology

## 2020-10-08 ENCOUNTER — Other Ambulatory Visit: Payer: Self-pay

## 2020-10-08 ENCOUNTER — Encounter
Admission: RE | Disposition: A | Discharge status: Home / Self Care | Payer: Self-pay | Source: Home / Self Care | Attending: Ophthalmology

## 2020-10-08 DIAGNOSIS — H2512 Age-related nuclear cataract, left eye: Secondary | ICD-10-CM | POA: Diagnosis present

## 2020-10-08 DIAGNOSIS — Z7982 Long term (current) use of aspirin: Secondary | ICD-10-CM | POA: Diagnosis not present

## 2020-10-08 DIAGNOSIS — Z79899 Other long term (current) drug therapy: Secondary | ICD-10-CM | POA: Insufficient documentation

## 2020-10-08 HISTORY — PX: CATARACT EXTRACTION W/PHACO: SHX586

## 2020-10-08 SURGERY — PHACOEMULSIFICATION, CATARACT, WITH IOL INSERTION
Anesthesia: Monitor Anesthesia Care | Site: Eye | Laterality: Left

## 2020-10-08 MED ORDER — TETRACAINE HCL 0.5 % OP SOLN
1.0000 [drp] | OPHTHALMIC | Status: DC | PRN
  Administered 2020-10-08 (×3): 1 [drp] via OPHTHALMIC

## 2020-10-08 MED ORDER — ACETAMINOPHEN 325 MG PO TABS: 325.0000 mg | ORAL_TABLET | ORAL | Status: DC | PRN

## 2020-10-08 MED ORDER — NA HYALUR & NA CHOND-NA HYALUR 0.4-0.35 ML IO KIT
PACK | INTRAOCULAR | Status: DC | PRN
  Administered 2020-10-08: 1 mL via INTRAOCULAR

## 2020-10-08 MED ORDER — ARMC OPHTHALMIC DILATING DROPS
1.0000 "application " | OPHTHALMIC | Status: DC | PRN
  Administered 2020-10-08 (×3): 1 via OPHTHALMIC

## 2020-10-08 MED ORDER — LACTATED RINGERS IV SOLN: INTRAVENOUS | Status: DC

## 2020-10-08 MED ORDER — MIDAZOLAM HCL 2 MG/2ML IJ SOLN
INTRAMUSCULAR | Status: DC | PRN
  Administered 2020-10-08 (×2): 1 mg via INTRAVENOUS

## 2020-10-08 MED ORDER — FENTANYL CITRATE (PF) 100 MCG/2ML IJ SOLN
INTRAMUSCULAR | Status: DC | PRN
  Administered 2020-10-08: 50 ug via INTRAVENOUS

## 2020-10-08 MED ORDER — BRIMONIDINE TARTRATE-TIMOLOL 0.2-0.5 % OP SOLN
OPHTHALMIC | Status: DC | PRN
  Administered 2020-10-08: 1 [drp] via OPHTHALMIC

## 2020-10-08 MED ORDER — EPINEPHRINE PF 1 MG/ML IJ SOLN
INTRAOCULAR | Status: DC | PRN
  Administered 2020-10-08: 70 mL via OPHTHALMIC

## 2020-10-08 MED ORDER — ONDANSETRON HCL 4 MG/2ML IJ SOLN: 4.0000 mg | Freq: Once | INTRAMUSCULAR | Status: DC | PRN

## 2020-10-08 MED ORDER — ACETAMINOPHEN 160 MG/5ML PO SOLN: 325.0000 mg | ORAL | Status: DC | PRN

## 2020-10-08 MED ORDER — LIDOCAINE HCL (PF) 2 % IJ SOLN
INTRAOCULAR | Status: DC | PRN
  Administered 2020-10-08: 1 mL

## 2020-10-08 MED ORDER — CEFUROXIME OPHTHALMIC INJECTION 1 MG/0.1 ML
INJECTION | OPHTHALMIC | Status: DC | PRN
  Administered 2020-10-08: 0.1 mL via INTRACAMERAL

## 2020-10-08 SURGICAL SUPPLY — 28 items
CANNULA ANT/CHMB 27G (MISCELLANEOUS) ×1 IMPLANT
CANNULA ANT/CHMB 27GA (MISCELLANEOUS) ×2 IMPLANT
GLOVE SURG LX 7.5 STRW (GLOVE) ×2
GLOVE SURG LX STRL 7.5 STRW (GLOVE) ×1 IMPLANT
GLOVE SURG TRIUMPH 8.0 PF LTX (GLOVE) ×2 IMPLANT
GOWN STRL REUS W/ TWL LRG LVL3 (GOWN DISPOSABLE) ×2 IMPLANT
GOWN STRL REUS W/TWL LRG LVL3 (GOWN DISPOSABLE) ×4
LENS IOL TECNIS EYHANCE 23.0 (Intraocular Lens) ×1 IMPLANT
MARKER SKIN DUAL TIP RULER LAB (MISCELLANEOUS) ×2 IMPLANT
NDL CAPSULORHEX 25GA (NEEDLE) ×1 IMPLANT
NDL FILTER BLUNT 18X1 1/2 (NEEDLE) ×2 IMPLANT
NDL RETROBULBAR .5 NSTRL (NEEDLE) IMPLANT
NEEDLE CAPSULORHEX 25GA (NEEDLE) ×2 IMPLANT
NEEDLE FILTER BLUNT 18X 1/2SAF (NEEDLE) ×2
NEEDLE FILTER BLUNT 18X1 1/2 (NEEDLE) ×2 IMPLANT
PACK CATARACT BRASINGTON (MISCELLANEOUS) ×2 IMPLANT
PACK EYE AFTER SURG (MISCELLANEOUS) ×2 IMPLANT
PACK OPTHALMIC (MISCELLANEOUS) ×2 IMPLANT
RING MALYGIN 7.0 (MISCELLANEOUS) IMPLANT
SOLUTION OPHTHALMIC SALT (MISCELLANEOUS) ×2 IMPLANT
SUT ETHILON 10-0 CS-B-6CS-B-6 (SUTURE)
SUT VICRYL  9 0 (SUTURE)
SUT VICRYL 9 0 (SUTURE) IMPLANT
SUTURE EHLN 10-0 CS-B-6CS-B-6 (SUTURE) IMPLANT
SYR 3ML LL SCALE MARK (SYRINGE) ×4 IMPLANT
SYR TB 1ML LUER SLIP (SYRINGE) ×2 IMPLANT
WATER STERILE IRR 250ML POUR (IV SOLUTION) ×2 IMPLANT
WIPE NON LINTING 3.25X3.25 (MISCELLANEOUS) ×2 IMPLANT

## 2020-10-08 NOTE — Anesthesia Preprocedure Evaluation (Signed)
Anesthesia Evaluation  Patient identified by MRN, date of birth, ID band Patient awake    Reviewed: Allergy & Precautions, H&P , NPO status , Patient's Chart, lab work & pertinent test results  Airway Mallampati: II  TM Distance: >3 FB Neck ROM: full    Dental no notable dental hx.    Pulmonary neg pulmonary ROS,    Pulmonary exam normal breath sounds clear to auscultation       Cardiovascular Exercise Tolerance: Good hypertension, On Medications Normal cardiovascular exam Rhythm:Regular Rate:Normal     Neuro/Psych Seizures -,  negative psych ROS   GI/Hepatic negative GI ROS, Neg liver ROS,   Endo/Other  negative endocrine ROS  Renal/GU negative Renal ROS  negative genitourinary   Musculoskeletal  (+) Arthritis ,   Abdominal Normal abdominal exam  (+) - obese,  Abdomen: soft.    Peds  Hematology negative hematology ROS (+)   Anesthesia Other Findings   Reproductive/Obstetrics negative OB ROS                             Anesthesia Physical  Anesthesia Plan  ASA: III  Anesthesia Plan: MAC   Post-op Pain Management:    Induction:   PONV Risk Score and Plan: 2 and Treatment may vary due to age or medical condition  Airway Management Planned: Natural Airway and Nasal Cannula  Additional Equipment:   Intra-op Plan:   Post-operative Plan:   Informed Consent: I have reviewed the patients History and Physical, chart, labs and discussed the procedure including the risks, benefits and alternatives for the proposed anesthesia with the patient or authorized representative who has indicated his/her understanding and acceptance.     Dental advisory given  Plan Discussed with: CRNA and Surgeon  Anesthesia Plan Comments:         Anesthesia Quick Evaluation  Patient Active Problem List   Diagnosis Date Noted  . Elevated alkaline phosphatase level 01/08/2019  . Advanced  care planning/counseling discussion 11/23/2016  . Essential hypertension 05/06/2015  . Hyperlipidemia 05/06/2015  . Osteoarthritis 05/06/2015  . 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    CBC Latest Ref Rng & Units 06/30/2018 03/06/2018 01/26/2014  WBC 3.4 - 10.8 x10E3/uL 3.8 5.7 8.1  Hemoglobin 11.1 - 15.9 g/dL 13.2 12.8 12.6  Hematocrit 34.0 - 46.6 % 40.5 38.1 38.4  Platelets 150 - 450 x10E3/uL 245 255 213   BMP Latest Ref Rng & Units 04/04/2020 06/30/2018 06/02/2018  Glucose 65 - 99 mg/dL 91 82 103(H)  BUN 8 - 27 mg/dL _0 Creatinine 0.57 - 1.00 mg/dL 0.78 0.98 0.86  BUN/Creat Ratio 12 - _1 Sodium 134 - 144 mmol/L 142 144 147(H)  Potassium 3.5 - 5.2 mmol/L 3.6 4.1 4.0  Chloride 96 - 106 mmol/L 104 107(H) 107(H)  CO2 20 - 29 mmol/L _2 Calcium 8.7 - 10.3 mg/dL 9.1 9.4 9.4    Risks and benefits of anesthesia discussed at length, patient or surrogate demonstrates understanding. Appropriately NPO. Plan to proceed with anesthesia.  Champ Mungo, MD 10/08/20

## 2020-10-08 NOTE — H&P (Signed)

## 2020-10-08 NOTE — Anesthesia Postprocedure Evaluation (Signed)
Anesthesia Post Note  Patient: Renee Lopez  Procedure(s) Performed: CATARACT EXTRACTION PHACO AND INTRAOCULAR LENS PLACEMENT (IOC) LEFT (Left Eye)     Patient location during evaluation: PACU Anesthesia Type: MAC Level of consciousness: awake and alert Pain management: pain level controlled Vital Signs Assessment: post-procedure vital signs reviewed and stable Respiratory status: spontaneous breathing, nonlabored ventilation, respiratory function stable and patient connected to nasal cannula oxygen Cardiovascular status: stable and blood pressure returned to baseline Postop Assessment: no apparent nausea or vomiting Anesthetic complications: no   No complications documented.  Sinda Du

## 2020-10-08 NOTE — Transfer of Care (Signed)
Immediate Anesthesia Transfer of Care Note  Patient: Renee Lopez  Procedure(s) Performed: CATARACT EXTRACTION PHACO AND INTRAOCULAR LENS PLACEMENT (IOC) LEFT (Left Eye)  Patient Location: PACU  Anesthesia Type: MAC  Level of Consciousness: awake, alert  and patient cooperative  Airway and Oxygen Therapy: Patient Spontanous Breathing   Post-op Assessment: Post-op Vital signs reviewed, Patient's Cardiovascular Status Stable, Respiratory Function Stable, Patent Airway and No signs of Nausea or vomiting  Post-op Vital Signs: Reviewed and stable  Complications: No complications documented.

## 2020-10-08 NOTE — Anesthesia Procedure Notes (Signed)
Procedure Name: MAC Date/Time: 10/08/2020 12:21 PM Performed by: Vanetta Shawl, CRNA Pre-anesthesia Checklist: Patient identified, Emergency Drugs available, Suction available, Timeout performed and Patient being monitored Patient Re-evaluated:Patient Re-evaluated prior to induction Oxygen Delivery Method: Nasal cannula Placement Confirmation: positive ETCO2

## 2020-10-08 NOTE — Op Note (Signed)
OPERATIVE NOTE  Renee Lopez [id number] 10/08/2020   PREOPERATIVE DIAGNOSIS:  Nuclear sclerotic cataract left eye. H25.12   POSTOPERATIVE DIAGNOSIS:    Nuclear sclerotic cataract left eye.     PROCEDURE:  Phacoemusification with posterior chamber intraocular lens placement of the left eye  Ultrasound time: Procedure(s) with comments: CATARACT EXTRACTION PHACO AND INTRAOCULAR LENS PLACEMENT (IOC) LEFT (Left) - 6.69 1:15.9 8.8%  LENS:   Implant Name Type Inv. Item Serial No. Manufacturer Lot No. LRB No. Used Action  LENS IOL TECNIS EYHANCE 23.0 - [id number] Intraocular Lens LENS IOL TECNIS EYHANCE 23.0 [id number] JOHNSON   Left 1 Implanted      SURGEON:  Deirdre Evener, MD   ANESTHESIA:  Topical with tetracaine drops and 2% Xylocaine jelly, augmented with 1% preservative-free intracameral lidocaine.    COMPLICATIONS:  None.   DESCRIPTION OF PROCEDURE:  The patient was identified in the holding room and transported to the operating room and placed in the supine position under the operating microscope.  The left eye was identified as the operative eye and it was prepped and draped in the usual sterile ophthalmic fashion.   A 1 millimeter clear-corneal paracentesis was made at the 1:30 position.  0.5 ml of preservative-free 1% lidocaine was injected into the anterior chamber.  The anterior chamber was filled with Viscoat viscoelastic.  A 2.4 millimeter keratome was used to make a near-clear corneal incision at the 10:30 position.  .  A curvilinear capsulorrhexis was made with a cystotome and capsulorrhexis forceps.  Balanced salt solution was used to hydrodissect and hydrodelineate the nucleus.   Phacoemulsification was then used in stop and chop fashion to remove the lens nucleus and epinucleus.  The remaining cortex was then removed using the irrigation and aspiration handpiece. Provisc was then placed into the capsular bag to distend it for lens placement.  A lens was  then injected into the capsular bag.  The remaining viscoelastic was aspirated.   Wounds were hydrated with balanced salt solution.  The anterior chamber was inflated to a physiologic pressure with balanced salt solution.  No wound leaks were noted. Cefuroxime 0.1 ml of a 10mg /ml solution was injected into the anterior chamber for a dose of 1 mg of intracameral antibiotic at the completion of the case.   Timolol and Brimonidine drops were applied to the eye.  The patient was taken to the recovery room in stable condition without complications of anesthesia or surgery.  [name] 10/08/2020, 12:42 PM

## 2020-10-28 ENCOUNTER — Ambulatory Visit: Payer: Medicare PPO | Admitting: Nurse Practitioner

## 2020-10-28 ENCOUNTER — Encounter: Payer: Self-pay | Admitting: Nurse Practitioner

## 2020-10-28 ENCOUNTER — Other Ambulatory Visit: Payer: Self-pay

## 2020-10-28 VITALS — BP 128/82 | HR 55 | Temp 97.5°F | Ht 61.58 in | Wt 142.6 lb

## 2020-10-28 DIAGNOSIS — K219 Gastro-esophageal reflux disease without esophagitis: Secondary | ICD-10-CM | POA: Diagnosis not present

## 2020-10-28 DIAGNOSIS — G40109 Localization-related (focal) (partial) symptomatic epilepsy and epileptic syndromes with simple partial seizures, not intractable, without status epilepticus: Secondary | ICD-10-CM | POA: Diagnosis not present

## 2020-10-28 DIAGNOSIS — I10 Essential (primary) hypertension: Secondary | ICD-10-CM

## 2020-10-28 DIAGNOSIS — E7800 Pure hypercholesterolemia, unspecified: Secondary | ICD-10-CM | POA: Diagnosis not present

## 2020-10-28 NOTE — Patient Instructions (Signed)

## 2020-10-28 NOTE — Assessment & Plan Note (Signed)
Chronic, stable with recent OTC Pepcid.  Mag level today.  Continue current regimen and adjust as needed.   

## 2020-10-28 NOTE — Assessment & Plan Note (Signed)
Chronic, ongoing.  Continue current medication regimen and adjust as needed. Lipid panel today. 

## 2020-10-28 NOTE — Progress Notes (Signed)
BP 128/82 (BP Location: Left Arm)   Pulse (!) 55   Temp (!) 97.5 F (36.4 C) (Oral)   Ht 5' 1.58" (1.564 m)   Wt 142 lb 9.6 oz (64.7 kg)   LMP  (LMP Unknown)   SpO2 100%   BMI 26.44 kg/m    Subjective:    Patient ID: Renee Lopez, female    DOB: 1941-05-10, 80 y.o.   MRN: [id number]  HPI: Renee Lopez is a 80 y.o. female  Chief Complaint  Patient presents with  . Bloodwork  . Hypertension  . Hyperlipidemia  . Seizures  . Gastroesophageal Reflux   HYPERTENSION / HYPERLIPIDEMIA Continues on ASA, Lipitor, and Micardis. Recent LDL in August 2021 = 74.  [place] and [place].  Satisfied with current treatment?yes Duration of hypertension:chronic BP monitoring frequency:daily BP range:130/80 range at home BP medication side effects:no Duration of hyperlipidemia:chronic Cholesterol medication side effects:no Cholesterol supplements: none Medication compliance:good compliance Aspirin:yes Recent stressors:no Recurrent headaches:no Visual changes:no Palpitations:no Dyspnea:no Chest pain:no Lower extremity edema:no Dizzy/lightheaded:no  SEIZURE DISORDER: Continues on Keppra 500 MG BID. Last level August 2021 = 26.7. Recent LFT and kidney function in August 2021 noted WNL. Denies abdominal pain, N&V, fever, SOB, or CP.Has not had a seizure in several years, "way back when I had to go to [facility]".  Only has history of 3 seizures.  GERD Continues Pepcid 40 MG daily. GERD control status: stable  Satisfied with current treatment? yes Heartburn frequency:  <1 time a month Medication side effects: no  Medication compliance: stable Previous GERD medications: TUMS Antacid use frequency:  rarely Location: epigastric Heartburn duration: 30 minutes Alleviatiating factors:  TUMS and Pepcid Aggravating factors: certain foods Dysphagia: no Odynophagia:  no Hematemesis: no Blood in stool: no EGD: no  Relevant past  medical, surgical, family and social history reviewed and updated as indicated. Interim medical history since our last visit reviewed. Allergies and medications reviewed and updated.  Review of Systems  Constitutional: Negative for activity change, appetite change, diaphoresis, fatigue and fever.  Respiratory: Negative for cough, chest tightness and shortness of breath.   Cardiovascular: Negative for chest pain, palpitations and leg swelling.  Gastrointestinal: Negative.   Neurological: Negative for dizziness, seizures, syncope, weakness, light-headedness, numbness and headaches.  Psychiatric/Behavioral: Negative.     Per HPI unless specifically indicated above     Objective:    BP 128/82 (BP Location: Left Arm)   Pulse (!) 55   Temp (!) 97.5 F (36.4 C) (Oral)   Ht 5' 1.58" (1.564 m)   Wt 142 lb 9.6 oz (64.7 kg)   LMP  (LMP Unknown)   SpO2 100%   BMI 26.44 kg/m   Wt Readings from Last 3 Encounters:  10/28/20 142 lb 9.6 oz (64.7 kg)  10/08/20 142 lb (64.4 kg)  09/16/20 141 lb (64 kg)    Physical Exam Vitals and nursing note reviewed.  Constitutional:      General: She is awake. She is not in acute distress.    Appearance: She is well-developed and well-groomed. She is not ill-appearing.  HENT:     Head: Normocephalic.     Right Ear: Hearing normal.     Left Ear: Hearing normal.  Eyes:     General: Lids are normal.        Right eye: No discharge.        Left eye: No discharge.     Conjunctiva/sclera: Conjunctivae normal.  Pupils: Pupils are equal, round, and reactive to light.  Neck:     Thyroid: No thyromegaly.     Vascular: No carotid bruit.  Cardiovascular:     Rate and Rhythm: Normal rate and regular rhythm.     Heart sounds: Normal heart sounds. No murmur heard. No gallop.   Pulmonary:     Effort: Pulmonary effort is normal. No accessory muscle usage or respiratory distress.     Breath sounds: Normal breath sounds.  Abdominal:     General: Bowel sounds  are normal.     Palpations: Abdomen is soft.  Musculoskeletal:     Cervical back: Normal range of motion and neck supple.     Right lower leg: No edema.     Left lower leg: No edema.  Skin:    General: Skin is warm and dry.  Neurological:     Mental Status: She is alert and oriented to person, place, and time.  Psychiatric:        Attention and Perception: Attention normal.        Mood and Affect: Mood normal.        Speech: Speech normal.        Behavior: Behavior normal. Behavior is cooperative.        Thought Content: Thought content normal.    Results for orders placed or performed during the hospital encounter of 10/06/20  SARS CORONAVIRUS 2 (TAT 6-24 HRS) Nasopharyngeal Nasopharyngeal Swab   Specimen: Nasopharyngeal Swab  Result Value Ref Range   SARS Coronavirus 2 NEGATIVE NEGATIVE      Assessment & Plan:   Problem List Items Addressed This Visit      Cardiovascular and Mediastinum   Essential hypertension - Primary    Chronic, ongoing with BP at goal for age at home and on repeat in office today at goal a.  Continue current medication regimen and adjust as needed.  CMP and TSH today.  Recommend continued focus on DASH diet at home and regular BP checks.  Return in 6 months.      Relevant Orders   Comprehensive metabolic panel   TSH     Digestive   GERD (gastroesophageal reflux disease)    Chronic, stable with recent OTC Pepcid.  Mag level today.  Continue current regimen and adjust as needed.        Relevant Orders   Magnesium     Nervous and Auditory   Focal epilepsy (HCC)    Chronic, stable on Keppra with no recent seizure activity.  CMP today and recent Keppra level stable.        Other   Hyperlipidemia    Chronic, ongoing.  Continue current medication regimen and adjust as needed.  Lipid panel today.      Relevant Orders   Lipid Panel w/o Chol/HDL Ratio       Follow up plan: Return in about 6 months (around 04/30/2021) for HTN/HLD, GERD,  SEIZURES.

## 2020-10-28 NOTE — Assessment & Plan Note (Signed)
Chronic, stable on Keppra with no recent seizure activity.  CMP today and recent Keppra level stable.

## 2020-10-28 NOTE — Assessment & Plan Note (Signed)
Chronic, ongoing with BP at goal for age at home and on repeat in office today at goal a.  Continue current medication regimen and adjust as needed.  CMP and TSH today.  Recommend continued focus on DASH diet at home and regular BP checks.  Return in 6 months.

## 2020-10-29 LAB — COMPREHENSIVE METABOLIC PANEL
ALT: 14 IU/L (ref 0–32)
AST: 19 IU/L (ref 0–40)
Albumin/Globulin Ratio: 1.7 (ref 1.2–2.2)
Albumin: 4 g/dL (ref 3.6–4.6)
Alkaline Phosphatase: 176 IU/L — ABNORMAL HIGH (ref 44–121)
BUN/Creatinine Ratio: 14 (ref 12–28)
BUN: 11 mg/dL (ref 8–27)
Bilirubin Total: 0.7 mg/dL (ref 0.0–1.2)
CO2: 23 mmol/L (ref 20–29)
Calcium: 8.9 mg/dL (ref 8.7–10.3)
Chloride: 105 mmol/L (ref 96–106)
Creatinine, Ser: 0.78 mg/dL (ref 0.57–1.00)
Globulin, Total: 2.4 g/dL (ref 1.5–4.5)
Glucose: 85 mg/dL (ref 65–99)
Potassium: 3.8 mmol/L (ref 3.5–5.2)
Sodium: 142 mmol/L (ref 134–144)
Total Protein: 6.4 g/dL (ref 6.0–8.5)
eGFR: 76 mL/min/{1.73_m2} (ref >59)

## 2020-10-29 LAB — LIPID PANEL W/O CHOL/HDL RATIO
Cholesterol, Total: 142 mg/dL (ref 100–199)
HDL: 34 mg/dL — ABNORMAL LOW (ref >39)
LDL Chol Calc (NIH): 90 mg/dL (ref 0–99)
Triglycerides: 92 mg/dL (ref 0–149)
VLDL Cholesterol Cal: 18 mg/dL (ref 5–40)

## 2020-10-29 LAB — TSH: TSH: 1.09 u[IU]/mL (ref 0.450–4.500)

## 2020-10-29 LAB — MAGNESIUM: Magnesium: 2.1 mg/dL (ref 1.6–2.3)

## 2020-10-29 NOTE — Progress Notes (Signed)
Good morning, please let Renee Lopez know her labs have returned and overall remain at baseline.  No medication changes needed.  Will continue to monitor at visits.  Any questions?  Have a great day!! Keep being awesome!!  Thank you for allowing me to participate in your care. Kindest regards, Naava Janeway

## 2021-04-30 ENCOUNTER — Other Ambulatory Visit: Payer: Self-pay

## 2021-04-30 ENCOUNTER — Ambulatory Visit: Payer: Medicare PPO | Admitting: Nurse Practitioner

## 2021-04-30 ENCOUNTER — Encounter: Payer: Self-pay | Admitting: Nurse Practitioner

## 2021-04-30 VITALS — BP 118/72 | HR 56 | Temp 97.8°F | Ht 61.0 in | Wt 140.6 lb

## 2021-04-30 DIAGNOSIS — K219 Gastro-esophageal reflux disease without esophagitis: Secondary | ICD-10-CM | POA: Diagnosis not present

## 2021-04-30 DIAGNOSIS — E7800 Pure hypercholesterolemia, unspecified: Secondary | ICD-10-CM | POA: Diagnosis not present

## 2021-04-30 DIAGNOSIS — I10 Essential (primary) hypertension: Secondary | ICD-10-CM | POA: Diagnosis not present

## 2021-04-30 DIAGNOSIS — G40109 Localization-related (focal) (partial) symptomatic epilepsy and epileptic syndromes with simple partial seizures, not intractable, without status epilepticus: Secondary | ICD-10-CM

## 2021-04-30 DIAGNOSIS — R748 Abnormal levels of other serum enzymes: Secondary | ICD-10-CM

## 2021-04-30 MED ORDER — LEVETIRACETAM 500 MG PO TABS: 500.0000 mg | ORAL_TABLET | Freq: Two times a day (BID) | ORAL | 4 refills | Status: DC

## 2021-04-30 MED ORDER — FAMOTIDINE 40 MG PO TABS: ORAL_TABLET | ORAL | 4 refills | Status: DC

## 2021-04-30 MED ORDER — TELMISARTAN 80 MG PO TABS: 80.0000 mg | ORAL_TABLET | Freq: Every day | ORAL | 4 refills | Status: DC

## 2021-04-30 MED ORDER — ATORVASTATIN CALCIUM 10 MG PO TABS: 10.0000 mg | ORAL_TABLET | Freq: Every day | ORAL | 4 refills | Status: DC

## 2021-04-30 NOTE — Assessment & Plan Note (Signed)
Chronic, stable on Keppra with no recent seizure activity.  BMP and Keppra level today.  Refills sent in.

## 2021-04-30 NOTE — Assessment & Plan Note (Signed)
Check GGT today.

## 2021-04-30 NOTE — Assessment & Plan Note (Signed)
Chronic, stable with recent OTC Pepcid.  Mag level up to date.  Continue current regimen and adjust as needed.   

## 2021-04-30 NOTE — Assessment & Plan Note (Addendum)
Chronic, ongoing.  Continue current medication regimen and adjust as needed.  Lipid panel today.  Refills sent in. 

## 2021-04-30 NOTE — Assessment & Plan Note (Signed)
Chronic, ongoing with BP at goal for age at home and on repeat in office today at goal.  Continue current medication regimen and adjust as needed.  BMP today.  Recommend continued focus on DASH diet at home and regular BP checks.  Refills sent in.  Return in 6 months.

## 2021-04-30 NOTE — Progress Notes (Signed)
BP 118/72 (BP Location: Left Arm)   Pulse (!) 56   Temp 97.8 F (36.6 C) (Oral)   Ht '5\' 1"'  (1.549 m)   Wt 140 lb 9.6 oz (63.8 kg)   LMP  (LMP Unknown)   SpO2 97%   BMI 26.57 kg/m    Subjective:    Patient ID: Renee Lopez, female    DOB: 1941/04/08, 80 y.o.   MRN: [id number]  HPI: Renee Lopez is a 80 y.o. female  Chief Complaint  Patient presents with   Hyperlipidemia   Hypertension    Patient states she has been keeping track of her blood pressure and it was good.    Gastroesophageal Reflux    Patient states her GERD hasn't gotten any better or worse, just about the same.    Seizures   HYPERTENSION / HYPERLIPIDEMIA Continues on ASA, Lipitor, and Micardis. 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 Satisfied with current treatment? yes Duration of hypertension: chronic BP monitoring frequency: daily BP range: 115/63 to 129/70 and HR 70 range BP medication side effects: no Duration of hyperlipidemia: chronic Cholesterol medication side effects: no Cholesterol supplements: none Medication compliance: good compliance Aspirin: yes Recent stressors: no Recurrent headaches: no Visual changes: no Palpitations: no Dyspnea: no Chest pain: no Lower extremity edema: no Dizzy/lightheaded: no    SEIZURE DISORDER: Continues on Keppra 500 MG BID.  Last level August 2021 = 26.7. Recent LFT and kidney function in March 2022 noted WNL.  Denies abdominal pain, N&V, fever, SOB, or CP.  Has not had a seizure in several years, not since [place].  Only has history of 3 seizures.  GERD Continues Pepcid 40 MG daily. GERD control status: stable  Satisfied with current treatment? yes Heartburn frequency:  <1 time a month Medication side effects: no  Medication compliance: stable Previous GERD medications: none Antacid use frequency: none Location: epigastric Heartburn duration: 30-60 minutes Alleviatiating factors:  TUMS and  Pepcid Aggravating factors: certain foods Dysphagia: no Odynophagia:  no Hematemesis: no Blood in stool: no EGD: no  Relevant past medical, surgical, family and social history reviewed and updated as indicated. Interim medical history since our last visit reviewed. Allergies and medications reviewed and updated.  Review of Systems  Constitutional:  Negative for activity change, appetite change, diaphoresis, fatigue and fever.  Respiratory:  Negative for cough, chest tightness and shortness of breath.   Cardiovascular:  Negative for chest pain, palpitations and leg swelling.  Gastrointestinal: Negative.   Neurological:  Negative for dizziness, seizures, syncope, weakness, light-headedness, numbness and headaches.  Psychiatric/Behavioral: Negative.     Per HPI unless specifically indicated above     Objective:    BP 118/72 (BP Location: Left Arm)   Pulse (!) 56   Temp 97.8 F (36.6 C) (Oral)   Ht '5\' 1"'  (1.549 m)   Wt 140 lb 9.6 oz (63.8 kg)   LMP  (LMP Unknown)   SpO2 97%   BMI 26.57 kg/m   Wt Readings from Last 3 Encounters:  04/30/21 140 lb 9.6 oz (63.8 kg)  10/28/20 142 lb 9.6 oz (64.7 kg)  10/08/20 142 lb (64.4 kg)    Physical Exam Vitals and nursing note reviewed.  Constitutional:      General: She is awake. She is not in acute distress.    Appearance: She is well-developed and well-groomed. She is not ill-appearing.  HENT:     Head: Normocephalic.     Right  Ear: Hearing normal.     Left Ear: Hearing normal.  Eyes:     General: Lids are normal.        Right eye: No discharge.        Left eye: No discharge.     Conjunctiva/sclera: Conjunctivae normal.     Pupils: Pupils are equal, round, and reactive to light.  Neck:     Thyroid: No thyromegaly.     Vascular: No carotid bruit.  Cardiovascular:     Rate and Rhythm: Normal rate and regular rhythm.     Heart sounds: Normal heart sounds. No murmur heard.   No gallop.  Pulmonary:     Effort: Pulmonary effort  is normal. No accessory muscle usage or respiratory distress.     Breath sounds: Normal breath sounds.  Abdominal:     General: Bowel sounds are normal.     Palpations: Abdomen is soft.  Musculoskeletal:     Cervical back: Normal range of motion and neck supple.     Right lower leg: No edema.     Left lower leg: No edema.  Skin:    General: Skin is warm and dry.  Neurological:     Mental Status: She is alert and oriented to person, place, and time.  Psychiatric:        Attention and Perception: Attention normal.        Mood and Affect: Mood normal.        Speech: Speech normal.        Behavior: Behavior normal. Behavior is cooperative.        Thought Content: Thought content normal.   Results for orders placed or performed in visit on 10/28/20  Comprehensive metabolic panel  Result Value Ref Range   Glucose 85 65 - 99 mg/dL   BUN 11 8 - 27 mg/dL   Creatinine, Ser 0.78 0.57 - 1.00 mg/dL   eGFR 76 >59 mL/min/1.73   BUN/Creatinine Ratio 14 12 - 28   Sodium 142 134 - 144 mmol/L   Potassium 3.8 3.5 - 5.2 mmol/L   Chloride 105 96 - 106 mmol/L   CO2 23 20 - 29 mmol/L   Calcium 8.9 8.7 - 10.3 mg/dL   Total Protein 6.4 6.0 - 8.5 g/dL   Albumin 4.0 3.6 - 4.6 g/dL   Globulin, Total 2.4 1.5 - 4.5 g/dL   Albumin/Globulin Ratio 1.7 1.2 - 2.2   Bilirubin Total 0.7 0.0 - 1.2 mg/dL   Alkaline Phosphatase 176 (H) 44 - 121 IU/L   AST 19 0 - 40 IU/L   ALT 14 0 - 32 IU/L  Lipid Panel w/o Chol/HDL Ratio  Result Value Ref Range   Cholesterol, Total 142 100 - 199 mg/dL   Triglycerides 92 0 - 149 mg/dL   HDL 34 (L) >39 mg/dL   VLDL Cholesterol Cal 18 5 - 40 mg/dL   LDL Chol Calc (NIH) 90 0 - 99 mg/dL  Magnesium  Result Value Ref Range   Magnesium 2.1 1.6 - 2.3 mg/dL  TSH  Result Value Ref Range   TSH 1.090 0.450 - 4.500 uIU/mL      Assessment & Plan:   Problem List Items Addressed This Visit       Cardiovascular and Mediastinum   Essential hypertension    Chronic, ongoing with  BP at goal for age at home and on repeat in office today at goal.  Continue current medication regimen and adjust as needed.  BMP today.  Recommend  continued focus on DASH diet at home and regular BP checks.  Refills sent in.  Return in 6 months.      Relevant Medications   atorvastatin (LIPITOR) 10 MG tablet   telmisartan (MICARDIS) 80 MG tablet   Other Relevant Orders   Basic metabolic panel     Digestive   GERD (gastroesophageal reflux disease)    Chronic, stable with recent OTC Pepcid.  Mag level up to date.  Continue current regimen and adjust as needed.        Relevant Medications   famotidine (PEPCID) 40 MG tablet     Nervous and Auditory   Focal epilepsy (HCC) - Primary    Chronic, stable on Keppra with no recent seizure activity.  BMP and Keppra level today.  Refills sent in.      Relevant Medications   levETIRAcetam (KEPPRA) 500 MG tablet   Other Relevant Orders   Levetiracetam level   Basic metabolic panel     Other   Hyperlipidemia    Chronic, ongoing.  Continue current medication regimen and adjust as needed.  Lipid panel today.  Refills sent in.      Relevant Medications   atorvastatin (LIPITOR) 10 MG tablet   telmisartan (MICARDIS) 80 MG tablet   Other Relevant Orders   Lipid Panel w/o Chol/HDL Ratio   Elevated alkaline phosphatase level    Check GGT today.      Relevant Orders   Gamma GT     Follow up plan: Return in about 6 months (around 10/28/2021) for HTN/HLD, SEIZURES, GERD.

## 2021-04-30 NOTE — Patient Instructions (Signed)

## 2021-05-01 NOTE — Progress Notes (Signed)
Good afternoon, please let Carmen know most labs have returned with exception of her Keppra level, if this is abnormal (too high) I will let her know.  Her kidney function is normal, as are electrolytes. Cholesterol levels remain normal, continue medication.  The GGT, which looks at gall bladder along with alkaline phosphatase, is elevated.  Do you still have gall bladder?  If so we will continue to monitor these to ensure no development of gall bladder issues.  Any questions? Keep being awesome!!  Thank you for allowing me to participate in your care.  I appreciate you. Kindest regards, Tamsin Nader

## 2021-05-04 LAB — LIPID PANEL W/O CHOL/HDL RATIO
Cholesterol, Total: 95 mg/dL — ABNORMAL LOW (ref 100–199)
HDL: 29 mg/dL — ABNORMAL LOW (ref >39)
LDL Chol Calc (NIH): 51 mg/dL (ref 0–99)
Triglycerides: 66 mg/dL (ref 0–149)
VLDL Cholesterol Cal: 15 mg/dL (ref 5–40)

## 2021-05-04 LAB — GAMMA GT: GGT: 229 IU/L — ABNORMAL HIGH (ref 0–60)

## 2021-05-04 LAB — BASIC METABOLIC PANEL
BUN/Creatinine Ratio: 14 (ref 12–28)
BUN: 11 mg/dL (ref 8–27)
CO2: 23 mmol/L (ref 20–29)
Calcium: 8.8 mg/dL (ref 8.7–10.3)
Chloride: 105 mmol/L (ref 96–106)
Creatinine, Ser: 0.79 mg/dL (ref 0.57–1.00)
Glucose: 77 mg/dL (ref 65–99)
Potassium: 3.9 mmol/L (ref 3.5–5.2)
Sodium: 143 mmol/L (ref 134–144)
eGFR: 75 mL/min/{1.73_m2} (ref >59)

## 2021-05-04 LAB — LEVETIRACETAM LEVEL: Levetiracetam Lvl: 24.1 ug/mL (ref 10.0–40.0)

## 2021-05-05 ENCOUNTER — Telehealth: Payer: Self-pay | Admitting: Nurse Practitioner

## 2021-05-05 NOTE — Telephone Encounter (Signed)
Pt given lab results per notes of Aura Dials NP  on 05/01/21. Pt verbalized understanding.  Pt had her gall bladder removed several years ago. She is awaiting the lepra results. She would like to stay on the Lepra unless it is doing more harm than good. Please call pt.

## 2021-05-05 NOTE — Telephone Encounter (Signed)
Pt is calling back for her lab results. Please advise CB- [phone] (H)

## 2021-05-06 NOTE — Telephone Encounter (Signed)
Routing to provider  

## 2021-05-06 NOTE — Telephone Encounter (Signed)
Spoke with patient and notified her of Jolene's recommendations. Patient verbalized understanding and has no further questions at this time.  

## 2021-06-03 ENCOUNTER — Ambulatory Visit: Payer: Medicare PPO

## 2021-06-05 ENCOUNTER — Ambulatory Visit (INDEPENDENT_AMBULATORY_CARE_PROVIDER_SITE_OTHER): Payer: Medicare PPO

## 2021-06-05 VITALS — BP 125/79 | HR 62 | Temp 96.8°F | Ht 60.0 in | Wt 140.0 lb

## 2021-06-05 DIAGNOSIS — Z0000 Encounter for general adult medical examination without abnormal findings: Secondary | ICD-10-CM

## 2021-06-05 NOTE — Patient Instructions (Signed)
Renee Lopez , Thank you for taking time to come for your Medicare Wellness Visit. I appreciate your ongoing commitment to your health goals. Please review the following plan we discussed and let me know if I can assist you in the future.   Screening recommendations/referrals: Colonoscopy: not required Mammogram: not required Bone Density: n/a Recommended yearly ophthalmology/optometry visit for glaucoma screening and checkup Recommended yearly dental visit for hygiene and checkup  Vaccinations: Influenza vaccine: due Pneumococcal vaccine: completed 05/19/2016 Tdap vaccine: completed 01/07/2014, due 2025 Shingles vaccine: discussed   Covid-19: 07/13/2020, 12/11/2019, 11/15/2019  Advanced directives: copy in chart  Conditions/risks identified: none  Next appointment: Follow up in one year for your annual wellness visit    Preventive Care 65 Years and Older, Female Preventive care refers to lifestyle choices and visits with your health care provider that can promote health and wellness. What does preventive care include? A yearly physical exam. This is also called an annual well check. Dental exams once or twice a year. Routine eye exams. Ask your health care provider how often you should have your eyes checked. Personal lifestyle choices, including: Daily care of your teeth and gums. Regular physical activity. Eating a healthy diet. Avoiding tobacco and drug use. Limiting alcohol use. Practicing safe sex. Taking low-dose aspirin every day. Taking vitamin and mineral supplements as recommended by your health care provider. What happens during an annual well check? The services and screenings done by your health care provider during your annual well check will depend on your age, overall health, lifestyle risk factors, and family history of disease. Counseling  Your health care provider may ask you questions about your: Alcohol use. Tobacco use. Drug use. Emotional  well-being. Home and relationship well-being. Sexual activity. Eating habits. History of falls. Memory and ability to understand (cognition). Work and work Astronomer. Reproductive health. Screening  You may have the following tests or measurements: Height, weight, and BMI. Blood pressure. Lipid and cholesterol levels. These may be checked every 5 years, or more frequently if you are over 36 years old. Skin check. Lung cancer screening. You may have this screening every year starting at age 24 if you have a 30-pack-year history of smoking and currently smoke or have quit within the past 15 years. Fecal occult blood test (FOBT) of the stool. You may have this test every year starting at age 41. Flexible sigmoidoscopy or colonoscopy. You may have a sigmoidoscopy every 5 years or a colonoscopy every 10 years starting at age 36. Hepatitis C blood test. Hepatitis B blood test. Sexually transmitted disease (STD) testing. Diabetes screening. This is done by checking your blood sugar (glucose) after you have not eaten for a while (fasting). You may have this done every 1-3 years. Bone density scan. This is done to screen for osteoporosis. You may have this done starting at age 80. Mammogram. This may be done every 1-2 years. Talk to your health care provider about how often you should have regular mammograms. Talk with your health care provider about your test results, treatment options, and if necessary, the need for more tests. Vaccines  Your health care provider may recommend certain vaccines, such as: Influenza vaccine. This is recommended every year. Tetanus, diphtheria, and acellular pertussis (Tdap, Td) vaccine. You may need a Td booster every 10 years. Zoster vaccine. You may need this after age 75. Pneumococcal 13-valent conjugate (PCV13) vaccine. One dose is recommended after age 73. Pneumococcal polysaccharide (PPSV23) vaccine. One dose is recommended after age 60. Talk  to your  health care provider about which screenings and vaccines you need and how often you need them. This information is not intended to replace advice given to you by your health care provider. Make sure you discuss any questions you have with your health care provider. Document Released: 09/12/2015 Document Revised: 05/05/2016 Document Reviewed: 06/17/2015 Elsevier Interactive Patient Education  2017 [place] Prevention in the Home Falls can cause injuries. They can happen to people of all ages. There are many things you can do to make your home safe and to help prevent falls. What can I do on the outside of my home? Regularly fix the edges of walkways and driveways and fix any cracks. Remove anything that might make you trip as you walk through a door, such as a raised step or threshold. Trim any bushes or trees on the path to your home. Use bright outdoor lighting. Clear any walking paths of anything that might make someone trip, such as rocks or tools. Regularly check to see if handrails are loose or broken. Make sure that both sides of any steps have handrails. Any raised decks and porches should have guardrails on the edges. Have any leaves, snow, or ice cleared regularly. Use sand or salt on walking paths during winter. Clean up any spills in your garage right away. This includes oil or grease spills. What can I do in the bathroom? Use night lights. Install grab bars by the toilet and in the tub and shower. Do not use towel bars as grab bars. Use non-skid mats or decals in the tub or shower. If you need to sit down in the shower, use a plastic, non-slip stool. Keep the floor dry. Clean up any water that spills on the floor as soon as it happens. Remove soap buildup in the tub or shower regularly. Attach bath mats securely with double-sided non-slip rug tape. Do not have throw rugs and other things on the floor that can make you trip. What can I do in the bedroom? Use night  lights. Make sure that you have a light by your bed that is easy to reach. Do not use any sheets or blankets that are too big for your bed. They should not hang down onto the floor. Have a firm chair that has side arms. You can use this for support while you get dressed. Do not have throw rugs and other things on the floor that can make you trip. What can I do in the kitchen? Clean up any spills right away. Avoid walking on wet floors. Keep items that you use a lot in easy-to-reach places. If you need to reach something above you, use a strong step stool that has a grab bar. Keep electrical cords out of the way. Do not use floor polish or wax that makes floors slippery. If you must use wax, use non-skid floor wax. Do not have throw rugs and other things on the floor that can make you trip. What can I do with my stairs? Do not leave any items on the stairs. Make sure that there are handrails on both sides of the stairs and use them. Fix handrails that are broken or loose. Make sure that handrails are as long as the stairways. Check any carpeting to make sure that it is firmly attached to the stairs. Fix any carpet that is loose or worn. Avoid having throw rugs at the top or bottom of the stairs. If you do have throw rugs,  attach them to the floor with carpet tape. Make sure that you have a light switch at the top of the stairs and the bottom of the stairs. If you do not have them, ask someone to add them for you. What else can I do to help prevent falls? Wear shoes that: Do not have high heels. Have rubber bottoms. Are comfortable and fit you well. Are closed at the toe. Do not wear sandals. If you use a stepladder: Make sure that it is fully opened. Do not climb a closed stepladder. Make sure that both sides of the stepladder are locked into place. Ask someone to hold it for you, if possible. Clearly mark and make sure that you can see: Any grab bars or handrails. First and last  steps. Where the edge of each step is. Use tools that help you move around (mobility aids) if they are needed. These include: Canes. Walkers. Scooters. Crutches. Turn on the lights when you go into a dark area. Replace any light bulbs as soon as they burn out. Set up your furniture so you have a clear path. Avoid moving your furniture around. If any of your floors are uneven, fix them. If there are any pets around you, be aware of where they are. Review your medicines with your doctor. Some medicines can make you feel dizzy. This can increase your chance of falling. Ask your doctor what other things that you can do to help prevent falls. This information is not intended to replace advice given to you by your health care provider. Make sure you discuss any questions you have with your health care provider. Document Released: 06/12/2009 Document Revised: 01/22/2016 Document Reviewed: 09/20/2014 Elsevier Interactive Patient Education  2017 [employer].

## 2021-06-05 NOTE — Progress Notes (Signed)
I connected with Renee Lopez today by telephone and verified that I am speaking with the correct person using two identifiers. Location patient: home Location provider: work Persons participating in the virtual visit: [name], [name] LPN.   I discussed the limitations, risks, security and privacy concerns of performing an evaluation and management service by telephone and the availability of in person appointments. I also discussed with the patient that there may be a patient responsible charge related to this service. The patient expressed understanding and verbally consented to this telephonic visit.    Interactive audio and video telecommunications were attempted between this provider and patient, however failed, due to patient having technical difficulties OR patient did not have access to video capability.  We continued and completed visit with audio only.     Vital signs may be patient reported or missing.  Subjective:   Renee Lopez is a 80 y.o. female who presents for Medicare Annual (Subsequent) preventive examination.  Review of Systems     Cardiac Risk Factors include: advanced age (>10men, >38 women);hypertension     Objective:    Today's Vitals   06/05/21 1026  BP: 125/79  Pulse: 62  Temp: (!) 96.8 F (36 C)  Weight: 140 lb (63.5 kg)  Height: 5' (1.524 m)   Body mass index is 27.34 kg/m.  Advanced Directives 06/05/2021 10/08/2020 09/16/2020 06/02/2020 05/28/2019 05/26/2018 05/04/2017  Does Patient Have a Medical Advance Directive? Yes Yes Yes Yes Yes Yes Yes  Type of Estate agent of [place];Living will Healthcare Power of [place];Living will Healthcare Power of [place];Living will Healthcare Power of [place];Living will Living will;Healthcare Power of Attorney Living will;Healthcare Power of [employer] Power of [place];Living will  Does patient want to make changes to medical advance directive? - No -  Patient declined No - Guardian declined - - - -  Copy of Healthcare Power of Attorney in Chart? Yes - validated most recent copy scanned in chart (See row information) Yes - validated most recent copy scanned in chart (See row information) Yes - validated most recent copy scanned in chart (See row information) Yes - validated most recent copy scanned in chart (See row information) No - copy requested No - copy requested No - copy requested    Current Medications (verified) Outpatient Encounter Medications as of 06/05/2021  Medication Sig   aspirin 81 MG tablet Take 81 mg by mouth daily.   atorvastatin (LIPITOR) 10 MG tablet Take 1 tablet (10 mg total) by mouth daily at 6 PM.   famotidine (PEPCID) 40 MG tablet TAKE 1 TABLET BY MOUTH EVERY DAY IN THE EVENING   levETIRAcetam (KEPPRA) 500 MG tablet Take 1 tablet (500 mg total) by mouth 2 (two) times daily.   telmisartan (MICARDIS) 80 MG tablet Take 1 tablet (80 mg total) by mouth daily.   No facility-administered encounter medications on file as of 06/05/2021.    Allergies (verified) Patient has no known allergies.   History: Past Medical History:  Diagnosis Date   Arthritis    Hyperlipidemia    Hypertension    Seizures (HCC)    Wears dentures    full upper and lower   Past Surgical History:  Procedure Laterality Date   CATARACT EXTRACTION W/PHACO Right 09/16/2020   Procedure: CATARACT EXTRACTION PHACO AND INTRAOCULAR LENS PLACEMENT (IOC) RIGHT 10.65 01:21.5 13.1%;  Surgeon: Lockie Mola, MD;  Location: [facility];  Service: Ophthalmology;  Laterality: Right;   CATARACT EXTRACTION W/PHACO Left 10/08/2020  Procedure: CATARACT EXTRACTION PHACO AND INTRAOCULAR LENS PLACEMENT (IOC) LEFT;  Surgeon: Lockie Mola, MD;  Location: [facility];  Service: Ophthalmology;  Laterality: Left;  6.69 1:15.9 8.8%   CHOLECYSTECTOMY     Family History  Problem Relation Age of Onset   Seizures Mother    Hyperlipidemia  Mother    Hypertension Mother    Cancer Father        colon   Hypertension Father    Social History   Socioeconomic History   Marital status: Married    Spouse name: Not on file   Number of children: Not on file   Years of education: Not on file   Highest education level: High school graduate  Occupational History   Occupation: retired  Tobacco Use   Smoking status: Never   Smokeless tobacco: Never  Vaping Use   Vaping Use: Never used  Substance and Sexual Activity   Alcohol use: No    Alcohol/week: 0.0 standard drinks   Drug use: No   Sexual activity: Yes  Other Topics Concern   Not on file  Social History Narrative   Not on file   Social Determinants of Health   Financial Resource Strain: Low Risk    Difficulty of Paying Living Expenses: Not hard at all  Food Insecurity: No Food Insecurity   Worried About Programme researcher, broadcasting/film/video in the Last Year: Never true   Ran Out of Food in the Last Year: Never true  Transportation Needs: No Transportation Needs   Lack of Transportation (Medical): No   Lack of Transportation (Non-Medical): No  Physical Activity: Inactive   Days of Exercise per Week: 0 days   Minutes of Exercise per Session: 0 min  Stress: No Stress Concern Present   Feeling of Stress : Not at all  Social Connections: Not on file    Tobacco Counseling Counseling given: Not Answered   Clinical Intake:  Pre-visit preparation completed: Yes  Pain : No/denies pain     Nutritional Status: BMI 25 -29 Overweight Nutritional Risks: None Diabetes: No  How often do you need to have someone help you when you read instructions, pamphlets, or other written materials from your doctor or pharmacy?: 1 - Never  Diabetic? no  Interpreter Needed?: No  Information entered by :: NAllen LPN   Activities of Daily Living In your present state of health, do you have any difficulty performing the following activities: 06/05/2021 10/08/2020  Hearing? N N  Vision? N N   Difficulty concentrating or making decisions? N N  Walking or climbing stairs? N N  Dressing or bathing? N N  Doing errands, shopping? N -  Preparing Food and eating ? N -  Using the Toilet? N -  In the past six months, have you accidently leaked urine? N -  Do you have problems with loss of bowel control? N -  Managing your Medications? N -  Managing your Finances? N -  Housekeeping or managing your Housekeeping? N -  Some recent data might be hidden    Patient Care Team: Marjie Skiff, NP as PCP - General (Nurse Practitioner)  Indicate any recent Medical Services you may have received from other than [facility] providers in the past year (date may be approximate).     Assessment:   This is a routine wellness examination for [place] [address].  Hearing/Vision screen Vision Screening - Comments:: Regular eye exams, [facility]  Dietary issues and exercise activities discussed: Current Exercise Habits: The  patient does not participate in regular exercise at present   Goals Addressed             This Visit's Progress    Patient Stated       06/05/2021, wants to increase water       Depression Screen PHQ 2/9 Scores 06/05/2021 10/28/2020 06/02/2020 05/28/2019 05/26/2018 05/04/2017 05/19/2016  PHQ - 2 Score 0 0 0 0 0 0 0  PHQ- 9 Score - - - - - - 0    Fall Risk Fall Risk  06/05/2021 04/30/2021 10/28/2020 06/02/2020 04/04/2020  Falls in the past year? 0 0 0 0 0  Number falls in past yr: - 0 - - 0  Injury with Fall? - 0 - - 0  Risk for fall due to : Medication side effect - - Medication side effect No Fall Risks  Follow up Falls evaluation completed;Education provided;Falls prevention discussed Falls evaluation completed - Falls evaluation completed;Education provided;Falls prevention discussed Falls evaluation completed    FALL RISK PREVENTION PERTAINING TO THE HOME:  Any stairs in or around the home? Yes  If so, are there any without handrails? No  Home free of loose throw rugs in  walkways, pet beds, electrical cords, etc? Yes  Adequate lighting in your home to reduce risk of falls? Yes   ASSISTIVE DEVICES UTILIZED TO PREVENT FALLS:  Life alert? No  Use of a cane, walker or w/c? No  Grab bars in the bathroom? No  Shower chair or bench in shower? No  Elevated toilet seat or a handicapped toilet? No   TIMED UP AND GO:  Was the test performed? No .       Cognitive Function:     6CIT Screen 06/05/2021 06/02/2020 05/26/2018 05/04/2017  What Year? 0 points 0 points 0 points 0 points  What month? 0 points 0 points 0 points 0 points  What time? 0 points 0 points 0 points 0 points  Count back from 20 0 points 0 points 0 points 0 points  Months in reverse 0 points 0 points 0 points 0 points  Repeat phrase 0 points 0 points 2 points 2 points  Total Score 0 0 2 2    Immunizations Immunization History  Administered Date(s) Administered   Influenza, High Dose Seasonal PF 06/24/2017, 06/07/2018, 06/19/2019, 06/14/2020   Influenza-Unspecified 06/09/2015, 06/10/2016, 06/21/2017   PFIZER(Purple Top)SARS-COV-2 Vaccination 11/15/2019, 12/11/2019, 07/13/2020   Pneumococcal Conjugate-13 05/10/2014   Pneumococcal Polysaccharide-23 05/19/2016   Td 01/07/2014    TDAP status: Up to date  Flu Vaccine status: Due, Education has been provided regarding the importance of this vaccine. Advised may receive this vaccine at local pharmacy or Health Dept. Aware to provide a copy of the vaccination record if obtained from local pharmacy or Health Dept. Verbalized acceptance and understanding.  Pneumococcal vaccine status: Up to date  Covid-19 vaccine status: Completed vaccines  Qualifies for Shingles Vaccine? Yes   Zostavax completed No   Shingrix Completed?: No.    Education has been provided regarding the importance of this vaccine. Patient has been advised to call insurance company to determine out of pocket expense if they have not yet received this vaccine. Advised may also  receive vaccine at local pharmacy or Health Dept. Verbalized acceptance and understanding.  Screening Tests Health Maintenance  Topic Date Due   COVID-19 Vaccine (4 - Booster for Pfizer series) 11/10/2020   INFLUENZA VACCINE  03/30/2021   Zoster Vaccines- Shingrix (1 of 2) 07/30/2021 (Originally 04/06/1991)  TETANUS/TDAP  01/08/2024   HPV VACCINES  Aged Out   DEXA SCAN  Discontinued    Health Maintenance  Health Maintenance Due  Topic Date Due   COVID-19 Vaccine (4 - Booster for Pfizer series) 11/10/2020   INFLUENZA VACCINE  03/30/2021    Colorectal cancer screening: No longer required.   Mammogram status: No longer required due to age.  Bone Density status: n/a  Lung Cancer Screening: (Low Dose CT Chest recommended if Age 32-80 years, 30 pack-year currently smoking OR have quit w/in 15years.) does not qualify.   Lung Cancer Screening Referral: no  Additional Screening:  Hepatitis C Screening: does not qualify;   Vision Screening: Recommended annual ophthalmology exams for early detection of glaucoma and other disorders of the eye. Is the patient up to date with their annual eye exam?  Yes  Who is the provider or what is the name of the office in which the patient attends annual eye exams? [facility] If pt is not established with a provider, would they like to be referred to a provider to establish care? No .   Dental Screening: Recommended annual dental exams for proper oral hygiene  Community Resource Referral / Chronic Care Management: CRR required this visit?  No   CCM required this visit?  No      Plan:     I have personally reviewed and noted the following in the patient's chart:   Medical and social history Use of alcohol, tobacco or illicit drugs  Current medications and supplements including opioid prescriptions.  Functional ability and status Nutritional status Physical activity Advanced directives List of other  physicians Hospitalizations, surgeries, and ER visits in previous 12 months Vitals Screenings to include cognitive, depression, and falls Referrals and appointments  In addition, I have reviewed and discussed with patient certain preventive protocols, quality metrics, and best practice recommendations. A written personalized care plan for preventive services as well as general preventive health recommendations were provided to patient.     Barb Merino, LPN   [id number]   Nurse Notes:

## 2021-10-25 NOTE — Patient Instructions (Signed)
Seizure, Adult °A seizure is a sudden burst of abnormal electrical and chemical activity in the brain. Seizures usually last from 30 seconds to 2 minutes.  °What are the causes? °Common causes of this condition include: °Fever or infection. °Problems that affect the brain. These may include: °A brain or head injury. °Bleeding in the brain. °A brain tumor. °Low levels of blood sugar or salt. °Kidney problems or liver problems. °Conditions that are passed from parent to child (are inherited). °Problems with a substance, such as: °Having a reaction to a drug or a medicine. °Stopping the use of a substance all of a sudden (withdrawal). °A stroke. °Disorders that affect how you develop. °Sometimes, the cause may not be known.  °What increases the risk? °Having someone in your family who has epilepsy. In this condition, seizures happen again and again over time. They have no clear cause. °Having had a tonic-clonic seizure before. This type of seizure causes you to: °Tighten the muscles of the whole body. °Lose consciousness. °Having had a head injury or strokes before. °Having had a lack of oxygen at birth. °What are the signs or symptoms? °There are many types of seizures. The symptoms vary depending on the type of seizure you have. °Symptoms during a seizure °Shaking that you cannot control (convulsions) with fast, jerky movements of muscles. °Stiffness of the body. °Breathing problems. °Feeling mixed up (confused). °Staring or not responding to sound or touch. °Head nodding. °Eyes that blink, flutter, or move fast. °Drooling, grunting, or making clicking sounds with your mouth °Losing control of when you pee or poop. °Symptoms before a seizure °Feeling afraid, nervous, or worried. °Feeling like you may vomit. °Feeling like: °You are moving when you are not. °Things around you are moving when they are not. °Feeling like you saw or heard something before (déjà vu). °Odd tastes or smells. °Changes in how you see. You may  see flashing lights or spots. °Symptoms after a seizure °Feeling confused. °Feeling sleepy. °Headache. °Sore muscles. °How is this treated? °If your seizure stops on its own, you will not need treatment. If your seizure lasts longer than 5 minutes, you will normally need treatment. Treatment may include: °Medicines given through an IV tube. °Avoiding things, such as medicines, that are known to cause your seizures. °Medicines to prevent seizures. °A device to prevent or control seizures. °Surgery. °A diet low in carbohydrates and high in fat (ketogenic diet). °Follow these instructions at home: °Medicines °Take over-the-counter and prescription medicines only as told by your doctor. °Avoid foods or drinks that may keep your medicine from working, such as alcohol. °Activity °Follow instructions about driving, swimming, or doing things that would be dangerous if you had another seizure. Wait until your doctor says it is safe for you to do these things. °If you live in the [place] ask your local department of motor vehicles when you can drive. °Get a lot of rest. °Teaching others ° °Teach friends and family what to do when you have a seizure. They should: °Help you get down to the ground. °Protect your head and body. °Loosen any clothing around your neck. °Turn you on your side. °Know whether or not you need emergency care. °Stay with you until you are better. °Also, tell them what not to do if you have a seizure. Tell them: °They should not hold you down. °They should not put anything in your mouth. °General instructions °Avoid anything that gives you seizures. °Keep a seizure diary. Write down: °What you remember   about each seizure. °What you think caused each seizure. °Keep all follow-up visits. °Contact a doctor if: °You have another seizure or seizures. Call the doctor each time you have a seizure. °The pattern of your seizures changes. °You keep having seizures with treatment. °You have symptoms of being sick or  having an infection. °You are not able to take your medicine. °Get help right away if: °You have any of these problems: °A seizure that lasts longer than 5 minutes. °Many seizures in a row and you do not feel better between seizures. °A seizure that makes it harder to breathe. °A seizure and you can no longer speak or use part of your body. °You do not wake up right after a seizure. °You get hurt during a seizure. °You feel confused or have pain right after a seizure. °These symptoms may be an emergency. Get help right away. Call your local emergency services (911 in the [place] °Do not wait to see if the symptoms will go away. °Do not drive yourself to the hospital. °Summary °A seizure is a sudden burst of abnormal electrical and chemical activity in the brain. Seizures normally last from 30 seconds to 2 minutes. °Causes of seizures include illness, injury to the head, low levels of blood sugar or salt, and certain conditions. °Most seizures will stop on their own in less than 5 minutes. Seizures that last longer than 5 minutes are a medical emergency and need treatment right away. °Many medicines are used to treat seizures. Take over-the-counter and prescription medicines only as told by your doctor. °This information is not intended to replace advice given to you by your health care provider. Make sure you discuss any questions you have with your health care provider. °Document Revised: 02/22/2020 Document Reviewed: 02/22/2020 °Elsevier Patient Education © 2022 [employer] ° °

## 2021-10-28 ENCOUNTER — Ambulatory Visit (INDEPENDENT_AMBULATORY_CARE_PROVIDER_SITE_OTHER): Payer: Medicare PPO | Admitting: Nurse Practitioner

## 2021-10-28 ENCOUNTER — Encounter: Payer: Self-pay | Admitting: Nurse Practitioner

## 2021-10-28 ENCOUNTER — Other Ambulatory Visit: Payer: Self-pay

## 2021-10-28 VITALS — BP 118/74 | HR 66 | Temp 97.5°F | Ht 60.0 in | Wt 139.2 lb

## 2021-10-28 DIAGNOSIS — G40109 Localization-related (focal) (partial) symptomatic epilepsy and epileptic syndromes with simple partial seizures, not intractable, without status epilepticus: Secondary | ICD-10-CM | POA: Diagnosis not present

## 2021-10-28 DIAGNOSIS — I10 Essential (primary) hypertension: Secondary | ICD-10-CM

## 2021-10-28 DIAGNOSIS — K219 Gastro-esophageal reflux disease without esophagitis: Secondary | ICD-10-CM

## 2021-10-28 DIAGNOSIS — R748 Abnormal levels of other serum enzymes: Secondary | ICD-10-CM | POA: Diagnosis not present

## 2021-10-28 DIAGNOSIS — E7800 Pure hypercholesterolemia, unspecified: Secondary | ICD-10-CM | POA: Diagnosis not present

## 2021-10-28 NOTE — Assessment & Plan Note (Signed)
History of gall bladder removal, overall no symptoms.  Monitor only at this time. 

## 2021-10-28 NOTE — Progress Notes (Signed)
? ?BP 118/74 (BP Location: Left Arm, Patient Position: Sitting, Cuff Size: Normal)   Pulse 66   Temp (!) 97.5 ?F (36.4 ?C) (Oral)   Ht 5' (1.524 m)   Wt 139 lb 3.2 oz (63.1 kg)   LMP  (LMP Unknown)   SpO2 96%   BMI 27.19 kg/m?   ? ?Subjective:  ? ? Patient ID: Renee Lopez, female    DOB: 1940/10/14, 81 y.o.   MRN: [id number] ? ?HPI: ?Renee Lopez is a 81 y.o. female ? ?Chief Complaint  ?Patient presents with  ? Hyperlipidemia  ? Hypertension  ? Seizures  ? Gastroesophageal Reflux  ? ?HYPERTENSION / HYPERLIPIDEMIA ?Continues on ASA, Lipitor, and Micardis.  ? 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  Has history of gall bladder removal in 2007, recent labs did not elevation in alk phos and GGT. ?Satisfied with current treatment? yes ?Duration of hypertension: chronic ?BP monitoring frequency: daily ?BP range: last night 126/72, range 120-140/70-80 ?BP medication side effects: no ?Duration of hyperlipidemia: chronic ?Cholesterol medication side effects: no ?Cholesterol supplements: none ?Medication compliance: good compliance ?Aspirin: yes ?Recent stressors: no ?Recurrent headaches: no ?Visual changes: no ?Palpitations: no ?Dyspnea: no ?Chest pain: no ?Lower extremity edema: no ?Dizzy/lightheaded: no  ?  ?SEIZURE DISORDER: ?Continues on Keppra 500 MG BID.  Last level 04/30/21 = 24.1. Recent LFT and kidney function in March 2022 noted WNL. Denies abdominal pain, N&V, SOB, or CP.  Has not had a seizure in several years, not since [place].  Only has history of 3 seizures. ? ?GERD ?Continues Pepcid 40 MG daily -- takes in the evening. ?GERD control status: stable  ?Satisfied with current treatment? yes ?Heartburn frequency:  occasional ?Medication side effects: no  ?Medication compliance: stable ?Previous GERD medications: none ?Antacid use frequency: none ?Location: epigastric ?Heartburn duration: 30 minutes ?Alleviatiating factors:  TUMS and Pepcid ?Aggravating factors:  certain foods ?Dysphagia: no ?Odynophagia:  no ?Hematemesis: no ?Blood in stool: no ?EGD: no ? ?Relevant past medical, surgical, family and social history reviewed and updated as indicated. Interim medical history since our last visit reviewed. ?Allergies and medications reviewed and updated. ? ?Review of Systems  ?Constitutional:  Negative for activity change, appetite change, diaphoresis, fatigue and fever.  ?Respiratory:  Negative for cough, chest tightness and shortness of breath.   ?Cardiovascular:  Negative for chest pain, palpitations and leg swelling.  ?Gastrointestinal: Negative.   ?Neurological:  Negative for dizziness, seizures, syncope, weakness, light-headedness, numbness and headaches.  ?Psychiatric/Behavioral: Negative.    ? ?Per HPI unless specifically indicated above ? ?   ?Objective:  ?  ?BP 118/74 (BP Location: Left Arm, Patient Position: Sitting, Cuff Size: Normal)   Pulse 66   Temp (!) 97.5 ?F (36.4 ?C) (Oral)   Ht 5' (1.524 m)   Wt 139 lb 3.2 oz (63.1 kg)   LMP  (LMP Unknown)   SpO2 96%   BMI 27.19 kg/m?   ?Wt Readings from Last 3 Encounters:  ?10/28/21 139 lb 3.2 oz (63.1 kg)  ?06/05/21 140 lb (63.5 kg)  ?04/30/21 140 lb 9.6 oz (63.8 kg)  ?  ?Physical Exam ?Vitals and nursing note reviewed.  ?Constitutional:   ?   General: She is awake. She is not in acute distress. ?   Appearance: She is well-developed and well-groomed. She is not ill-appearing.  ?HENT:  ?   Head: Normocephalic.  ?   Right Ear: Hearing normal.  ?   Left Ear:  Hearing normal.  ?Eyes:  ?   General: Lids are normal.     ?   Right eye: No discharge.     ?   Left eye: No discharge.  ?   Conjunctiva/sclera: Conjunctivae normal.  ?   Pupils: Pupils are equal, round, and reactive to light.  ?Neck:  ?   Thyroid: No thyromegaly.  ?   Vascular: No carotid bruit.  ?Cardiovascular:  ?   Rate and Rhythm: Normal rate and regular rhythm.  ?   Heart sounds: Normal heart sounds. No murmur heard. ?  No gallop.  ?Pulmonary:  ?   Effort:  Pulmonary effort is normal. No accessory muscle usage or respiratory distress.  ?   Breath sounds: Normal breath sounds.  ?Abdominal:  ?   General: Bowel sounds are normal.  ?   Palpations: Abdomen is soft.  ?Musculoskeletal:  ?   Cervical back: Normal range of motion and neck supple.  ?   Right lower leg: No edema.  ?   Left lower leg: No edema.  ?Skin: ?   General: Skin is warm and dry.  ?Neurological:  ?   Mental Status: She is alert and oriented to person, place, and time.  ?Psychiatric:     ?   Attention and Perception: Attention normal.     ?   Mood and Affect: Mood normal.     ?   Speech: Speech normal.     ?   Behavior: Behavior normal. Behavior is cooperative.     ?   Thought Content: Thought content normal.  ? ?Results for orders placed or performed in visit on 04/30/21  ?Levetiracetam level  ?Result Value Ref Range  ? Levetiracetam Lvl 24.1 10.0 - 40.0 ug/mL  ?Basic metabolic panel  ?Result Value Ref Range  ? Glucose 77 65 - 99 mg/dL  ? BUN 11 8 - 27 mg/dL  ? Creatinine, Ser 0.79 0.57 - 1.00 mg/dL  ? eGFR 75 >59 mL/min/1.73  ? BUN/Creatinine Ratio 14 12 - 28  ? Sodium 143 134 - 144 mmol/L  ? Potassium 3.9 3.5 - 5.2 mmol/L  ? Chloride 105 96 - 106 mmol/L  ? CO2 23 20 - 29 mmol/L  ? Calcium 8.8 8.7 - 10.3 mg/dL  ?Lipid Panel w/o Chol/HDL Ratio  ?Result Value Ref Range  ? Cholesterol, Total 95 (L) 100 - 199 mg/dL  ? Triglycerides 66 0 - 149 mg/dL  ? HDL 29 (L) >39 mg/dL  ? VLDL Cholesterol Cal 15 5 - 40 mg/dL  ? LDL Chol Calc (NIH) 51 0 - 99 mg/dL  ?Gamma GT  ?Result Value Ref Range  ? GGT 229 (H) 0 - 60 IU/L  ? ?   ?Assessment & Plan:  ? ?Problem List Items Addressed This Visit   ? ?  ? Cardiovascular and Mediastinum  ? Essential hypertension  ?  Chronic, ongoing with BP at goal for age at home and on repeat in office today at goal.  Continue current medication regimen and adjust as needed.  CMP, TSH today.  Recommend continued focus on DASH diet at home and regular BP checks.  Refills up to date.  Return  in 6 months. ?  ?  ? Relevant Orders  ? Comprehensive metabolic panel  ? TSH  ?  ? Digestive  ? GERD (gastroesophageal reflux disease)  ?  Chronic, stable with recent OTC Pepcid.  Mag level today.  Continue current regimen and adjust as needed.   ?  ?  ?  Relevant Orders  ? Magnesium  ?  ? Nervous and Auditory  ? 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  Chronic, stable on Keppra with no recent seizure activity.  CMP and Keppra level today.  Refills up to date. ?  ?  ? Relevant Orders  ? Comprehensive metabolic panel  ? Levetiracetam level  ?  ? Other  ? Elevated alkaline phosphatase level  ?  History of gall bladder removal, overall no symptoms.  Monitor only at this time. ?  ?  ? Relevant Orders  ? Comprehensive metabolic panel  ? Hyperlipidemia  ?  Chronic, ongoing.  Continue current medication regimen and adjust as needed.  Lipid panel today.  Refills up to date. ?  ?  ? Relevant Orders  ? Comprehensive metabolic panel  ? Lipid Panel w/o Chol/HDL Ratio  ?  ? ?Follow up plan: ?Return in about 6 months (around 04/30/2022) for HTN/HLD, SEIZURES, GERD. ?

## 2021-10-28 NOTE — Assessment & Plan Note (Signed)
Chronic, ongoing.  Continue current medication regimen and adjust as needed.  Lipid panel today.  Refills up to date.  

## 2021-10-28 NOTE — Assessment & Plan Note (Signed)
Chronic, stable with recent OTC Pepcid.  Mag level today.  Continue current regimen and adjust as needed.   

## 2021-10-28 NOTE — Assessment & Plan Note (Signed)
Chronic, stable on Keppra with no recent seizure activity.  CMP and Keppra level today.  Refills up to date. ?

## 2021-10-28 NOTE — Assessment & Plan Note (Signed)
Chronic, ongoing with BP at goal for age at home and on repeat in office today at goal.  Continue current medication regimen and adjust as needed.  CMP, TSH today.  Recommend continued focus on DASH diet at home and regular BP checks.  Refills up to date.  Return in 6 months. ?

## 2021-10-29 NOTE — Progress Notes (Signed)
Please let [name] know her labs have returned and overall remain stable with no changes needed.  I am waiting on Keppra level still and will contact her if this returns abnormal and any changes needed.  Have a great day!! ?Keep being inspiring!!  Thank you for allowing me to participate in your care.  I appreciate you. ?Kindest regards, ?Zephyr Ridley ?

## 2021-10-30 LAB — COMPREHENSIVE METABOLIC PANEL
ALT: 12 IU/L (ref 0–32)
AST: 21 IU/L (ref 0–40)
Albumin/Globulin Ratio: 2 (ref 1.2–2.2)
Albumin: 4.4 g/dL (ref 3.6–4.6)
Alkaline Phosphatase: 168 IU/L — ABNORMAL HIGH (ref 44–121)
BUN/Creatinine Ratio: 15 (ref 12–28)
BUN: 13 mg/dL (ref 8–27)
Bilirubin Total: 0.8 mg/dL (ref 0.0–1.2)
CO2: 22 mmol/L (ref 20–29)
Calcium: 9.3 mg/dL (ref 8.7–10.3)
Chloride: 104 mmol/L (ref 96–106)
Creatinine, Ser: 0.85 mg/dL (ref 0.57–1.00)
Globulin, Total: 2.2 g/dL (ref 1.5–4.5)
Glucose: 88 mg/dL (ref 70–99)
Potassium: 4 mmol/L (ref 3.5–5.2)
Sodium: 139 mmol/L (ref 134–144)
Total Protein: 6.6 g/dL (ref 6.0–8.5)
eGFR: 68 mL/min/{1.73_m2} (ref >59)

## 2021-10-30 LAB — LEVETIRACETAM LEVEL: Levetiracetam Lvl: 28.3 ug/mL (ref 10.0–40.0)

## 2021-10-30 LAB — TSH: TSH: 1.82 u[IU]/mL (ref 0.450–4.500)

## 2021-10-30 LAB — LIPID PANEL W/O CHOL/HDL RATIO
Cholesterol, Total: 142 mg/dL (ref 100–199)
HDL: 34 mg/dL — ABNORMAL LOW (ref >39)
LDL Chol Calc (NIH): 91 mg/dL (ref 0–99)
Triglycerides: 88 mg/dL (ref 0–149)
VLDL Cholesterol Cal: 17 mg/dL (ref 5–40)

## 2021-10-30 LAB — MAGNESIUM: Magnesium: 2.1 mg/dL (ref 1.6–2.3)

## 2022-04-30 ENCOUNTER — Ambulatory Visit: Payer: Medicare PPO | Admitting: Nurse Practitioner

## 2022-04-30 DIAGNOSIS — E7800 Pure hypercholesterolemia, unspecified: Secondary | ICD-10-CM

## 2022-04-30 DIAGNOSIS — I10 Essential (primary) hypertension: Secondary | ICD-10-CM

## 2022-04-30 DIAGNOSIS — G40109 Localization-related (focal) (partial) symptomatic epilepsy and epileptic syndromes with simple partial seizures, not intractable, without status epilepticus: Secondary | ICD-10-CM

## 2022-04-30 DIAGNOSIS — R748 Abnormal levels of other serum enzymes: Secondary | ICD-10-CM

## 2022-05-09 NOTE — Patient Instructions (Signed)

## 2022-05-13 ENCOUNTER — Other Ambulatory Visit: Payer: Self-pay | Admitting: Nurse Practitioner

## 2022-05-13 NOTE — Telephone Encounter (Signed)
Requested Prescriptions  Pending Prescriptions Disp Refills  . atorvastatin (LIPITOR) 10 MG tablet [Pharmacy Med Name: ATORVASTATIN 10 MG TABLET] 90 tablet 2    Sig: TAKE 1 TABLET (10 MG TOTAL) BY MOUTH DAILY AT 6 PM.     Cardiovascular:  Antilipid - Statins Failed - 05/13/2022  2:37 AM      Failed - Lipid Panel in normal range within the last 12 months    Cholesterol, Total  Date Value Ref Range Status  10/28/2021 142 100 - 199 mg/dL Final   Cholesterol  Date Value Ref Range Status  12/12/2011 151 0 - 200 mg/dL Final   Cholesterol Piccolo, Waived  Date Value Ref Range Status  05/06/2015 123 <200 mg/dL Final    Comment:                            Desirable                <200                         Borderline High      200- 239                         High                     >239    Ldl Cholesterol, Calc  Date Value Ref Range Status  12/12/2011 112 (H) 0 - 100 mg/dL Final   LDL Chol Calc (NIH)  Date Value Ref Range Status  10/28/2021 91 0 - 99 mg/dL Final   HDL Cholesterol  Date Value Ref Range Status  12/12/2011 31 (L) 40 - 60 mg/dL Final   HDL  Date Value Ref Range Status  10/28/2021 34 (L) >39 mg/dL Final   Triglycerides  Date Value Ref Range Status  10/28/2021 88 0 - 149 mg/dL Final  84/13/2440 39 0 - 200 mg/dL Final   Triglycerides Piccolo,Waived  Date Value Ref Range Status  05/06/2015 85 <150 mg/dL Final    Comment:                            Normal                   <150                         Borderline High     150 - 199                         High                200 - 499                         Very High                >499          Passed - Patient is not pregnant      Passed - Valid encounter within last 12 months    Recent Outpatient Visits          6 months ago Focal epilepsy (HCC)   [facility] [place], Corrie Dandy T, NP   1 year  ago Focal epilepsy (HCC)   [facility] [place], Dorie Rank, NP   1 year ago  Essential hypertension   [facility] [place], Arabi T, NP   2 years ago Focal epilepsy (HCC)   [facility] [place], Corrie Dandy T, NP   2 years ago Focal epilepsy (HCC)   [facility] [place], Dorie Rank, NP      Future Appointments            Tomorrow Marjie Skiff, NP [facility], [facility]   In 3 weeks  [facility], [facility]

## 2022-05-14 ENCOUNTER — Ambulatory Visit (INDEPENDENT_AMBULATORY_CARE_PROVIDER_SITE_OTHER): Payer: Medicare PPO | Admitting: Nurse Practitioner

## 2022-05-14 ENCOUNTER — Encounter: Payer: Self-pay | Admitting: Nurse Practitioner

## 2022-05-14 VITALS — BP 124/80 | HR 57 | Temp 97.6°F | Ht 60.0 in | Wt 135.7 lb

## 2022-05-14 DIAGNOSIS — K219 Gastro-esophageal reflux disease without esophagitis: Secondary | ICD-10-CM

## 2022-05-14 DIAGNOSIS — G40109 Localization-related (focal) (partial) symptomatic epilepsy and epileptic syndromes with simple partial seizures, not intractable, without status epilepticus: Secondary | ICD-10-CM

## 2022-05-14 DIAGNOSIS — I10 Essential (primary) hypertension: Secondary | ICD-10-CM | POA: Diagnosis not present

## 2022-05-14 DIAGNOSIS — E7800 Pure hypercholesterolemia, unspecified: Secondary | ICD-10-CM | POA: Diagnosis not present

## 2022-05-14 DIAGNOSIS — R748 Abnormal levels of other serum enzymes: Secondary | ICD-10-CM | POA: Diagnosis not present

## 2022-05-14 MED ORDER — LEVETIRACETAM 500 MG PO TABS: 500.0000 mg | ORAL_TABLET | Freq: Two times a day (BID) | ORAL | 4 refills | Status: DC

## 2022-05-14 MED ORDER — ATORVASTATIN CALCIUM 10 MG PO TABS: 10.0000 mg | ORAL_TABLET | Freq: Every day | ORAL | 4 refills | Status: DC

## 2022-05-14 MED ORDER — TELMISARTAN 80 MG PO TABS: 80.0000 mg | ORAL_TABLET | Freq: Every day | ORAL | 4 refills | Status: DC

## 2022-05-14 MED ORDER — FAMOTIDINE 40 MG PO TABS: ORAL_TABLET | ORAL | 4 refills | Status: DC

## 2022-05-14 NOTE — Progress Notes (Signed)
BP 124/80 (BP Location: Left Arm, Patient Position: Sitting, Cuff Size: Normal)   Pulse (!) 57   Temp 97.6 F (36.4 C) (Oral)   Ht 5' (1.524 m)   Wt 135 lb 11.2 oz (61.6 kg)   LMP  (LMP Unknown)   SpO2 95%   BMI 26.50 kg/m    Subjective:    Patient ID: Renee Lopez, female    DOB: 06-29-1941, 81 y.o.   MRN: [id number]  HPI: JAIDON SPONSEL is a 81 y.o. female  Chief Complaint  Patient presents with   Hyperlipidemia   Hypertension   Seizures   Gastroesophageal Reflux   HYPERTENSION / HYPERLIPIDEMIA Continues on ASA, Lipitor, and Micardis. 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  Has history of gall bladder removal in 2007, recent labs did not elevation in alk phos and GGT. Satisfied with current treatment? yes Duration of hypertension: chronic BP monitoring frequency: daily BP range: 122/69 to 120/64 -- on average is this at home = HR 60 range BP medication side effects: no Duration of hyperlipidemia: chronic Cholesterol medication side effects: no Cholesterol supplements: none Medication compliance: good compliance Aspirin: yes Recent stressors: no Recurrent headaches: no Visual changes: no Palpitations: no Dyspnea: no Chest pain: no Lower extremity edema: no Dizzy/lightheaded: no    SEIZURE DISORDER: Continues on Keppra 500 MG BID.  No recent seizures reported.  Denies abdominal pain, N&V, SOB, or CP.  Has not had a seizure in several years, not since [place].  Only has history of 3 seizures.  GERD Taking Pepcid 40 MG every evening GERD control status: stable  Satisfied with current treatment? yes Heartburn frequency:  occasional Medication side effects: no  Medication compliance: stable Previous GERD medications: none Antacid use frequency: occasional TUMS Dysphagia: no Odynophagia:  no Hematemesis: no Blood in stool: no EGD: no  Relevant past medical, surgical, family and social history  reviewed and updated as indicated. Interim medical history since our last visit reviewed. Allergies and medications reviewed and updated.  Review of Systems  Constitutional:  Negative for activity change, appetite change, diaphoresis, fatigue and fever.  Respiratory:  Negative for cough, chest tightness and shortness of breath.   Cardiovascular:  Negative for chest pain, palpitations and leg swelling.  Gastrointestinal: Negative.   Neurological:  Negative for dizziness, seizures, syncope, weakness, light-headedness, numbness and headaches.  Psychiatric/Behavioral: Negative.      Per HPI unless specifically indicated above     Objective:    BP 124/80 (BP Location: Left Arm, Patient Position: Sitting, Cuff Size: Normal)   Pulse (!) 57   Temp 97.6 F (36.4 C) (Oral)   Ht 5' (1.524 m)   Wt 135 lb 11.2 oz (61.6 kg)   LMP  (LMP Unknown)   SpO2 95%   BMI 26.50 kg/m   Wt Readings from Last 3 Encounters:  05/14/22 135 lb 11.2 oz (61.6 kg)  10/28/21 139 lb 3.2 oz (63.1 kg)  06/05/21 140 lb (63.5 kg)    Physical Exam Vitals and nursing note reviewed.  Constitutional:      General: She is awake. She is not in acute distress.    Appearance: She is well-developed and well-groomed. She is not ill-appearing.  HENT:     Head: Normocephalic.     Right Ear: Hearing normal.     Left Ear: Hearing normal.  Eyes:     General: Lids are normal.  Right eye: No discharge.        Left eye: No discharge.     Conjunctiva/sclera: Conjunctivae normal.     Pupils: Pupils are equal, round, and reactive to light.  Neck:     Thyroid: No thyromegaly.     Vascular: No carotid bruit.  Cardiovascular:     Rate and Rhythm: Normal rate and regular rhythm.     Heart sounds: Normal heart sounds. No murmur heard.    No gallop.  Pulmonary:     Effort: Pulmonary effort is normal. No accessory muscle usage or respiratory distress.     Breath sounds: Normal breath sounds.  Abdominal:     General:  Bowel sounds are normal.     Palpations: Abdomen is soft.  Musculoskeletal:     Cervical back: Normal range of motion and neck supple.     Right lower leg: No edema.     Left lower leg: No edema.  Skin:    General: Skin is warm and dry.  Neurological:     Mental Status: She is alert and oriented to person, place, and time.  Psychiatric:        Attention and Perception: Attention normal.        Mood and Affect: Mood normal.        Speech: Speech normal.        Behavior: Behavior normal. Behavior is cooperative.        Thought Content: Thought content normal.    Results for orders placed or performed in visit on 10/28/21  Comprehensive metabolic panel  Result Value Ref Range   Glucose 88 70 - 99 mg/dL   BUN 13 8 - 27 mg/dL   Creatinine, Ser 0.85 0.57 - 1.00 mg/dL   eGFR 68 >59 mL/min/1.73   BUN/Creatinine Ratio 15 12 - 28   Sodium 139 134 - 144 mmol/L   Potassium 4.0 3.5 - 5.2 mmol/L   Chloride 104 96 - 106 mmol/L   CO2 22 20 - 29 mmol/L   Calcium 9.3 8.7 - 10.3 mg/dL   Total Protein 6.6 6.0 - 8.5 g/dL   Albumin 4.4 3.6 - 4.6 g/dL   Globulin, Total 2.2 1.5 - 4.5 g/dL   Albumin/Globulin Ratio 2.0 1.2 - 2.2   Bilirubin Total 0.8 0.0 - 1.2 mg/dL   Alkaline Phosphatase 168 (H) 44 - 121 IU/L   AST 21 0 - 40 IU/L   ALT 12 0 - 32 IU/L  Lipid Panel w/o Chol/HDL Ratio  Result Value Ref Range   Cholesterol, Total 142 100 - 199 mg/dL   Triglycerides 88 0 - 149 mg/dL   HDL 34 (L) >39 mg/dL   VLDL Cholesterol Cal 17 5 - 40 mg/dL   LDL Chol Calc (NIH) 91 0 - 99 mg/dL  Levetiracetam level  Result Value Ref Range   Levetiracetam Lvl 28.3 10.0 - 40.0 ug/mL  TSH  Result Value Ref Range   TSH 1.820 0.450 - 4.500 uIU/mL  Magnesium  Result Value Ref Range   Magnesium 2.1 1.6 - 2.3 mg/dL      Assessment & Plan:   Problem List Items Addressed This Visit       Cardiovascular and Mediastinum   Essential hypertension    Chronic, ongoing with initial BP elevated (suspect some white  coat) and repeat improved -- home BP normal.  Continue current medication regimen and adjust as needed.  LABS: CBC and CMP today.  Recommend continued focus on DASH diet at home  and regular BP checks.  Refills sent in. Return in 6 months.      Relevant Medications   atorvastatin (LIPITOR) 10 MG tablet   telmisartan (MICARDIS) 80 MG tablet   Other Relevant Orders   Comprehensive metabolic panel   CBC with Differential/Platelet     Digestive   GERD (gastroesophageal reflux disease)    Chronic, stable with recent OTC Pepcid.  Mag level up to date.  Continue current regimen and adjust as needed.        Relevant Medications   famotidine (PEPCID) 40 MG tablet     Nervous and Auditory   Focal epilepsy (HCC) - Primary    Chronic, stable on Keppra.  No recent seizure activity.  CMP today.  Refills up to date.      Relevant Medications   levETIRAcetam (KEPPRA) 500 MG tablet   Other Relevant Orders   Comprehensive metabolic panel   CBC with Differential/Platelet     Other   Elevated alkaline phosphatase level    History of gall bladder removal, overall no symptoms.  Monitor only at this time.      Relevant Orders   Gamma GT   Comprehensive metabolic panel   Hyperlipidemia    Chronic, ongoing.  Continue current medication regimen and adjust as needed.  Lipid panel today.  Refills sent.      Relevant Medications   atorvastatin (LIPITOR) 10 MG tablet   telmisartan (MICARDIS) 80 MG tablet   Other Relevant Orders   Comprehensive metabolic panel   Lipid Panel w/o Chol/HDL Ratio     Follow up plan: Return in about 6 months (around 11/12/2022) for HTN/HLD, SEIZURES.

## 2022-05-14 NOTE — Assessment & Plan Note (Signed)
Chronic, stable with recent OTC Pepcid.  Mag level up to date.  Continue current regimen and adjust as needed.

## 2022-05-14 NOTE — Assessment & Plan Note (Signed)
Chronic, stable on Keppra.  No recent seizure activity.  CMP today.  Refills up to date.

## 2022-05-14 NOTE — Assessment & Plan Note (Signed)
History of gall bladder removal, overall no symptoms.  Monitor only at this time.

## 2022-05-14 NOTE — Assessment & Plan Note (Signed)
Chronic, ongoing with initial BP elevated (suspect some white coat) and repeat improved -- home BP normal.  Continue current medication regimen and adjust as needed.  LABS: CBC and CMP today.  Recommend continued focus on DASH diet at home and regular BP checks.  Refills sent in. Return in 6 months.

## 2022-05-14 NOTE — Assessment & Plan Note (Signed)
Chronic, ongoing.  Continue current medication regimen and adjust as needed.  Lipid panel today.  Refills sent.   

## 2022-05-15 LAB — CBC WITH DIFFERENTIAL/PLATELET
Basophils Absolute: 0 10*3/uL (ref 0.0–0.2)
Basos: 1 %
EOS (ABSOLUTE): 0.3 10*3/uL (ref 0.0–0.4)
Eos: 6 %
Hematocrit: 42.7 % (ref 34.0–46.6)
Hemoglobin: 13.3 g/dL (ref 11.1–15.9)
Immature Grans (Abs): 0 10*3/uL (ref 0.0–0.1)
Immature Granulocytes: 0 %
Lymphocytes Absolute: 1.3 10*3/uL (ref 0.7–3.1)
Lymphs: 30 %
MCH: 29.2 pg (ref 26.6–33.0)
MCHC: 31.1 g/dL — ABNORMAL LOW (ref 31.5–35.7)
MCV: 94 fL (ref 79–97)
Monocytes Absolute: 0.4 10*3/uL (ref 0.1–0.9)
Monocytes: 10 %
Neutrophils Absolute: 2.2 10*3/uL (ref 1.4–7.0)
Neutrophils: 53 %
Platelets: 245 10*3/uL (ref 150–450)
RBC: 4.55 x10E6/uL (ref 3.77–5.28)
RDW: 13 % (ref 11.7–15.4)
WBC: 4.1 10*3/uL (ref 3.4–10.8)

## 2022-05-15 LAB — COMPREHENSIVE METABOLIC PANEL
ALT: 13 IU/L (ref 0–32)
AST: 20 IU/L (ref 0–40)
Albumin/Globulin Ratio: 1.8 (ref 1.2–2.2)
Albumin: 4.1 g/dL (ref 3.7–4.7)
Alkaline Phosphatase: 145 IU/L — ABNORMAL HIGH (ref 44–121)
BUN/Creatinine Ratio: 18 (ref 12–28)
BUN: 16 mg/dL (ref 8–27)
Bilirubin Total: 0.9 mg/dL (ref 0.0–1.2)
CO2: 26 mmol/L (ref 20–29)
Calcium: 9.5 mg/dL (ref 8.7–10.3)
Chloride: 104 mmol/L (ref 96–106)
Creatinine, Ser: 0.9 mg/dL (ref 0.57–1.00)
Globulin, Total: 2.3 g/dL (ref 1.5–4.5)
Glucose: 98 mg/dL (ref 70–99)
Potassium: 4.5 mmol/L (ref 3.5–5.2)
Sodium: 143 mmol/L (ref 134–144)
Total Protein: 6.4 g/dL (ref 6.0–8.5)
eGFR: 63 mL/min/{1.73_m2} (ref >59)

## 2022-05-15 LAB — LIPID PANEL W/O CHOL/HDL RATIO
Cholesterol, Total: 126 mg/dL (ref 100–199)
HDL: 38 mg/dL — ABNORMAL LOW (ref >39)
LDL Chol Calc (NIH): 75 mg/dL (ref 0–99)
Triglycerides: 62 mg/dL (ref 0–149)
VLDL Cholesterol Cal: 13 mg/dL (ref 5–40)

## 2022-05-15 LAB — GAMMA GT: GGT: 102 IU/L — ABNORMAL HIGH (ref 0–60)

## 2022-05-15 NOTE — Progress Notes (Signed)
Good morning Renee Lopez, please let Renee Lopez know her labs have returned and remain at baseline for her with no medication changes needed.  Overall labs look great.  Have a wonderful day!!

## 2022-06-07 ENCOUNTER — Ambulatory Visit: Payer: Medicare PPO

## 2022-06-08 ENCOUNTER — Ambulatory Visit (INDEPENDENT_AMBULATORY_CARE_PROVIDER_SITE_OTHER): Payer: Medicare PPO | Admitting: *Deleted

## 2022-06-08 DIAGNOSIS — Z0000 Encounter for general adult medical examination without abnormal findings: Secondary | ICD-10-CM | POA: Diagnosis not present

## 2022-06-08 NOTE — Progress Notes (Signed)
Subjective:   Renee Lopez is a 81 y.o. female who presents for Medicare Annual (Subsequent) preventive examination.  I connected with  [name] on 06/08/22 by a telephone enabled telemedicine application and verified that I am speaking with the correct person using two identifiers.   I discussed the limitations of evaluation and management by telemedicine. The patient expressed understanding and agreed to proceed.  Patient location: home  Provider location:  Tele-health-home    Review of Systems     Cardiac Risk Factors include: advanced age (>70men, >18 women);hypertension     Objective:    Today's Vitals   There is no height or weight on file to calculate BMI.     06/08/2022    9:34 AM 06/05/2021   10:30 AM 10/08/2020   10:59 AM 09/16/2020   10:08 AM 06/02/2020    9:49 AM 05/28/2019    9:38 AM 05/26/2018   10:08 AM  Advanced Directives  Does Patient Have a Medical Advance Directive? Yes Yes Yes Yes Yes Yes Yes  Type of Paramedic of [place];Living will [place];Living will [place];Living will [place];Living will Living will;Healthcare Power of Attorney Living will;Healthcare Power of Attorney  Does patient want to make changes to medical advance directive?   No - Patient declined No - Guardian declined     Copy of [place] in Chart? Yes - validated most recent copy scanned in chart (See row information) Yes - validated most recent copy scanned in chart (See row information) Yes - validated most recent copy scanned in chart (See row information) Yes - validated most recent copy scanned in chart (See row information) Yes - validated most recent copy scanned in chart (See row information) No - copy requested No - copy requested    Current Medications (verified) Outpatient Encounter Medications as of 06/08/2022  Medication  Sig   aspirin 81 MG tablet Take 81 mg by mouth daily.   atorvastatin (LIPITOR) 10 MG tablet Take 1 tablet (10 mg total) by mouth daily at 6 PM.   famotidine (PEPCID) 40 MG tablet TAKE 1 TABLET BY MOUTH EVERY DAY IN THE EVENING   levETIRAcetam (KEPPRA) 500 MG tablet Take 1 tablet (500 mg total) by mouth 2 (two) times daily.   telmisartan (MICARDIS) 80 MG tablet Take 1 tablet (80 mg total) by mouth daily.   No facility-administered encounter medications on file as of 06/08/2022.    Allergies (verified) Patient has no known allergies.   History: Past Medical History:  Diagnosis Date   Arthritis    Hyperlipidemia    Hypertension    Seizures ([place])    Wears dentures    full upper and lower   Past Surgical History:  Procedure Laterality Date   CATARACT EXTRACTION W/PHACO Right 09/16/2020   Procedure: CATARACT EXTRACTION PHACO AND INTRAOCULAR LENS PLACEMENT ([facility]) RIGHT 10.65 01:21.5 13.1%;  Surgeon: Leandrew Koyanagi, MD;  Location: [facility];  Service: Ophthalmology;  Laterality: Right;   CATARACT EXTRACTION W/PHACO Left 10/08/2020   Procedure: CATARACT EXTRACTION PHACO AND INTRAOCULAR LENS PLACEMENT ([place]) LEFT;  Surgeon: Leandrew Koyanagi, MD;  Location: [facility];  Service: Ophthalmology;  Laterality: Left;  6.69 1:15.9 8.8%   CHOLECYSTECTOMY     Family History  Problem Relation Age of Onset   Seizures Mother    Hyperlipidemia Mother    Hypertension Mother    Cancer Father  colon   Hypertension Father    Social History   Socioeconomic History   Marital status: Married    Spouse name: Not on file   Number of children: Not on file   Years of education: Not on file   Highest education level: High school graduate  Occupational History   Occupation: retired  Tobacco Use   Smoking status: Never   Smokeless tobacco: Never  Vaping Use   Vaping Use: Never used  Substance and Sexual Activity   Alcohol use: No    Alcohol/week: 0.0 standard  drinks of alcohol   Drug use: No   Sexual activity: Yes  Other Topics Concern   Not on file  Social History Narrative   Not on file   Social Determinants of Health   Financial Resource Strain: Low Risk  (06/08/2022)   Overall Financial Resource Strain (CARDIA)    Difficulty of Paying Living Expenses: Not hard at all  Food Insecurity: No Food Insecurity (06/08/2022)   Hunger Vital Sign    Worried About Running Out of Food in the Last Year: Never true    Ran Out of Food in the Last Year: Never true  Transportation Needs: No Transportation Needs (06/08/2022)   PRAPARE - Administrator, Civil Service (Medical): No    Lack of Transportation (Non-Medical): No  Physical Activity: Inactive (06/08/2022)   Exercise Vital Sign    Days of Exercise per Week: 0 days    Minutes of Exercise per Session: 0 min  Stress: No Stress Concern Present (06/08/2022)   [employer] of Occupational Health - Occupational Stress Questionnaire    Feeling of Stress : Not at all  Social Connections: Moderately Integrated (06/08/2022)   Social Connection and Isolation Panel [NHANES]    Frequency of Communication with Friends and Family: More than three times a week    Frequency of Social Gatherings with Friends and Family: Twice a week    Attends Religious Services: More than 4 times per year    Active Member of [employer] or Organizations: No    Attends Engineer, structural: Never    Marital Status: Married    Tobacco Counseling Counseling given: Not Answered   Clinical Intake:  Pre-visit preparation completed: Yes  Pain : No/denies pain        How often do you need to have someone help you when you read instructions, pamphlets, or other written materials from your doctor or pharmacy?: 1 - Never  Diabetic?  no  Interpreter Needed?: No  Information entered by :: Remi Haggard LPN   Activities of Daily Living    06/08/2022    9:42 AM  In your present state of health,  do you have any difficulty performing the following activities:  Hearing? 0  Vision? 0  Difficulty concentrating or making decisions? 0  Walking or climbing stairs? 0  Dressing or bathing? 0  Doing errands, shopping? 1  Preparing Food and eating ? N  Using the Toilet? N  In the past six months, have you accidently leaked urine? N  Do you have problems with loss of bowel control? N  Managing your Medications? N  Managing your Finances? N  Housekeeping or managing your Housekeeping? N    Patient Care Team: Marjie Skiff, NP as PCP - General (Nurse Practitioner)  Indicate any recent Medical Services you may have received from other than [facility] providers in the past year (date may be approximate).     Assessment:  This is a routine wellness examination for [place].  Hearing/Vision screen Hearing Screening - Comments:: No trouble hearing Vision Screening - Comments:: Up to date [address] [facility] Has had cataract surgery  Dietary issues and exercise activities discussed: Current Exercise Habits: The patient does not participate in regular exercise at present   Goals Addressed             This Visit's Progress    DIET - INCREASE WATER INTAKE   On track    Recommend drinking at least 6-8 glasses of water a day      Patient Stated   On track    06/02/2020, decrease soda and juice intake     Patient Stated       Wants to work in garden more       Depression Screen    06/08/2022    9:39 AM 05/14/2022   11:02 AM 10/28/2021   10:49 AM 06/05/2021   10:31 AM 10/28/2020   11:25 AM 06/02/2020    9:51 AM 05/28/2019    9:37 AM  PHQ 2/9 Scores  PHQ - 2 Score 0 0 0 0 0 0 0  PHQ- 9 Score 0 0 0        Fall Risk    06/08/2022    9:35 AM 05/14/2022   11:03 AM 10/28/2021   10:48 AM 06/05/2021   10:31 AM 04/30/2021   10:44 AM  Fall Risk   Falls in the past year? 0 0 0 0 0  Number falls in past yr: 0 0 0  0  Injury with Fall? 0 0 0  0  Risk for fall due to :  No Fall Risks No Fall  Risks Medication side effect   Follow up Falls evaluation completed;Education provided;Falls prevention discussed Falls evaluation completed Falls evaluation completed Falls evaluation completed;Education provided;Falls prevention discussed Falls evaluation completed    FALL RISK PREVENTION PERTAINING TO THE HOME:  Any stairs in or around the home? No  If so, are there any without handrails? No  Home free of loose throw rugs in walkways, pet beds, electrical cords, etc? Yes  Adequate lighting in your home to reduce risk of falls? Yes   ASSISTIVE DEVICES UTILIZED TO PREVENT FALLS:  Life alert? No  Use of a cane, walker or w/c? No  Grab bars in the bathroom? No  Shower chair or bench in shower? No  Elevated toilet seat or a handicapped toilet? Yes   TIMED UP AND GO:  Was the test performed? No .    Cognitive Function:        06/08/2022    9:35 AM 06/05/2021   10:33 AM 06/02/2020    9:53 AM 05/26/2018   10:08 AM 05/04/2017   10:28 AM  6CIT Screen  What Year? 0 points 0 points 0 points 0 points 0 points  What month? 0 points 0 points 0 points 0 points 0 points  What time? 0 points 0 points 0 points 0 points 0 points  Count back from 20 0 points 0 points 0 points 0 points 0 points  Months in reverse 0 points 0 points 0 points 0 points 0 points  Repeat phrase 0 points 0 points 0 points 2 points 2 points  Total Score 0 points 0 points 0 points 2 points 2 points    Immunizations Immunization History  Administered Date(s) Administered   Influenza, High Dose Seasonal PF 06/24/2017, 06/07/2018, 06/19/2019, 06/14/2020, 06/13/2021   Influenza-Unspecified 06/09/2015, 06/10/2016, 06/21/2017  PFIZER(Purple Top)SARS-COV-2 Vaccination 11/15/2019, 12/11/2019, 07/13/2020   Pfizer Covid-19 Vaccine Bivalent Booster 41yrs & up 07/18/2021   Pneumococcal Conjugate-13 05/10/2014   Pneumococcal Polysaccharide-23 05/19/2016   Td 01/07/2014    TDAP status: Up to date  Flu Vaccine status:  Due, Education has been provided regarding the importance of this vaccine. Advised may receive this vaccine at local pharmacy or Health Dept. Aware to provide a copy of the vaccination record if obtained from local pharmacy or Health Dept. Verbalized acceptance and understanding.  Pneumococcal vaccine status: Up to date  Covid-19 vaccine status: Information provided on how to obtain vaccines.   Qualifies for Shingles Vaccine? Yes   Zostavax completed No   Shingrix Completed?: No.    Education has been provided regarding the importance of this vaccine. Patient has been advised to call insurance company to determine out of pocket expense if they have not yet received this vaccine. Advised may also receive vaccine at local pharmacy or Health Dept. Verbalized acceptance and understanding.  Screening Tests Health Maintenance  Topic Date Due   COVID-19 Vaccine (5 - Pfizer series) 11/15/2021   Zoster Vaccines- Shingrix (1 of 2) 08/13/2022 (Originally 04/06/1991)   INFLUENZA VACCINE  11/28/2022 (Originally 03/30/2022)   TETANUS/TDAP  01/08/2024   Pneumonia Vaccine 23+ Years old  Completed   HPV VACCINES  Aged Out   DEXA SCAN  Discontinued    Health Maintenance  Health Maintenance Due  Topic Date Due   COVID-19 Vaccine (5 - Pfizer series) 11/15/2021    Colorectal cancer screening: No longer required.   Mammogram status: No longer required due to ag3.  Bone Density n/a  declined  Lung Cancer Screening: (Low Dose CT Chest recommended if Age 67-80 years, 30 pack-year currently smoking OR have quit w/in 15years.) does not qualify.   Lung Cancer Screening Referral:   Additional Screening:  Hepatitis C Screening: does not qualify  Vision Screening: Recommended annual ophthalmology exams for early detection of glaucoma and other disorders of the eye. Is the patient up to date with their annual eye exam?  Yes  Who is the provider or what is the name of the office in which the patient attends  annual eye exams? [facility] If pt is not established with a provider, would they like to be referred to a provider to establish care? No .   Dental Screening: Recommended annual dental exams for proper oral hygiene  Community Resource Referral / Chronic Care Management: CRR required this visit?  No   CCM required this visit?  No      Plan:     I have personally reviewed and noted the following in the patient's chart:   Medical and social history Use of alcohol, tobacco or illicit drugs  Current medications and supplements including opioid prescriptions. Patient is not currently taking opioid prescriptions. Functional ability and status Nutritional status Physical activity Advanced directives List of other physicians Hospitalizations, surgeries, and ER visits in previous 12 months Vitals Screenings to include cognitive, depression, and falls Referrals and appointments  In addition, I have reviewed and discussed with patient certain preventive protocols, quality metrics, and best practice recommendations. A written personalized care plan for preventive services as well as general preventive health recommendations were provided to patient.     Remi Haggard, LPN   [id number]   Nurse Notes:

## 2022-06-08 NOTE — Patient Instructions (Signed)
Renee Lopez , Thank you for taking time to come for your Medicare Wellness Visit. I appreciate your ongoing commitment to your health goals. Please review the following plan we discussed and let me know if I can assist you in the future.   These are the goals we discussed:  Goals      DIET - INCREASE WATER INTAKE     Recommend drinking at least 6-8 glasses of water a day      Patient Stated     06/02/2020, decrease soda and juice intake     Patient Stated     06/05/2021, wants to increase water     Patient Stated     Wants to work in garden more        This is a list of the screening recommended for you and due dates:  Health Maintenance  Topic Date Due   COVID-19 Vaccine (5 - Pfizer series) 11/15/2021   Zoster (Shingles) Vaccine (1 of 2) 08/13/2022*   Flu Shot  11/28/2022*   Tetanus Vaccine  01/08/2024   Pneumonia Vaccine  Completed   HPV Vaccine  Aged Out   DEXA scan (bone density measurement)  Discontinued  *Topic was postponed. The date shown is not the original due date.    Advanced directives: yes on file  Conditions/risks identified:   Next appointment: Follow up in one year for your annual wellness visit 11-12-2022  @ 9:40  [facility]   Preventive Care 65 Years and Older, Female Preventive care refers to lifestyle choices and visits with your health care provider that can promote health and wellness. What does preventive care include? A yearly physical exam. This is also called an annual well check. Dental exams once or twice a year. Routine eye exams. Ask your health care provider how often you should have your eyes checked. Personal lifestyle choices, including: Daily care of your teeth and gums. Regular physical activity. Eating a healthy diet. Avoiding tobacco and drug use. Limiting alcohol use. Practicing safe sex. Taking low-dose aspirin every day. Taking vitamin and mineral supplements as recommended by your health care provider. What happens during  an annual well check? The services and screenings done by your health care provider during your annual well check will depend on your age, overall health, lifestyle risk factors, and family history of disease. Counseling  Your health care provider may ask you questions about your: Alcohol use. Tobacco use. Drug use. Emotional well-being. Home and relationship well-being. Sexual activity. Eating habits. History of falls. Memory and ability to understand (cognition). Work and work Astronomer. Reproductive health. Screening  You may have the following tests or measurements: Height, weight, and BMI. Blood pressure. Lipid and cholesterol levels. These may be checked every 5 years, or more frequently if you are over 77 years old. Skin check. Lung cancer screening. You may have this screening every year starting at age 12 if you have a 30-pack-year history of smoking and currently smoke or have quit within the past 15 years. Fecal occult blood test (FOBT) of the stool. You may have this test every year starting at age 71. Flexible sigmoidoscopy or colonoscopy. You may have a sigmoidoscopy every 5 years or a colonoscopy every 10 years starting at age 88. Hepatitis C blood test. Hepatitis B blood test. Sexually transmitted disease (STD) testing. Diabetes screening. This is done by checking your blood sugar (glucose) after you have not eaten for a while (fasting). You may have this done every 1-3 years. Bone density scan.  This is done to screen for osteoporosis. You may have this done starting at age 28. Mammogram. This may be done every 1-2 years. Talk to your health care provider about how often you should have regular mammograms. Talk with your health care provider about your test results, treatment options, and if necessary, the need for more tests. Vaccines  Your health care provider may recommend certain vaccines, such as: Influenza vaccine. This is recommended every year. Tetanus,  diphtheria, and acellular pertussis (Tdap, Td) vaccine. You may need a Td booster every 10 years. Zoster vaccine. You may need this after age 37. Pneumococcal 13-valent conjugate (PCV13) vaccine. One dose is recommended after age 55. Pneumococcal polysaccharide (PPSV23) vaccine. One dose is recommended after age 56. Talk to your health care provider about which screenings and vaccines you need and how often you need them. This information is not intended to replace advice given to you by your health care provider. Make sure you discuss any questions you have with your health care provider. Document Released: 09/12/2015 Document Revised: 05/05/2016 Document Reviewed: 06/17/2015 Elsevier Interactive Patient Education  2017 [employer].  Fall Prevention in the Home Falls can cause injuries. They can happen to people of all ages. There are many things you can do to make your home safe and to help prevent falls. What can I do on the outside of my home? Regularly fix the edges of walkways and driveways and fix any cracks. Remove anything that might make you trip as you walk through a door, such as a raised step or threshold. Trim any bushes or trees on the path to your home. Use bright outdoor lighting. Clear any walking paths of anything that might make someone trip, such as rocks or tools. Regularly check to see if handrails are loose or broken. Make sure that both sides of any steps have handrails. Any raised decks and porches should have guardrails on the edges. Have any leaves, snow, or ice cleared regularly. Use sand or salt on walking paths during winter. Clean up any spills in your garage right away. This includes oil or grease spills. What can I do in the bathroom? Use night lights. Install grab bars by the toilet and in the tub and shower. Do not use towel bars as grab bars. Use non-skid mats or decals in the tub or shower. If you need to sit down in the shower, use a plastic,  non-slip stool. Keep the floor dry. Clean up any water that spills on the floor as soon as it happens. Remove soap buildup in the tub or shower regularly. Attach bath mats securely with double-sided non-slip rug tape. Do not have throw rugs and other things on the floor that can make you trip. What can I do in the bedroom? Use night lights. Make sure that you have a light by your bed that is easy to reach. Do not use any sheets or blankets that are too big for your bed. They should not hang down onto the floor. Have a firm chair that has side arms. You can use this for support while you get dressed. Do not have throw rugs and other things on the floor that can make you trip. What can I do in the kitchen? Clean up any spills right away. Avoid walking on wet floors. Keep items that you use a lot in easy-to-reach places. If you need to reach something above you, use a strong step stool that has a grab bar. Keep electrical cords  out of the way. Do not use floor polish or wax that makes floors slippery. If you must use wax, use non-skid floor wax. Do not have throw rugs and other things on the floor that can make you trip. What can I do with my stairs? Do not leave any items on the stairs. Make sure that there are handrails on both sides of the stairs and use them. Fix handrails that are broken or loose. Make sure that handrails are as long as the stairways. Check any carpeting to make sure that it is firmly attached to the stairs. Fix any carpet that is loose or worn. Avoid having throw rugs at the top or bottom of the stairs. If you do have throw rugs, attach them to the floor with carpet tape. Make sure that you have a light switch at the top of the stairs and the bottom of the stairs. If you do not have them, ask someone to add them for you. What else can I do to help prevent falls? Wear shoes that: Do not have high heels. Have rubber bottoms. Are comfortable and fit you well. Are closed  at the toe. Do not wear sandals. If you use a stepladder: Make sure that it is fully opened. Do not climb a closed stepladder. Make sure that both sides of the stepladder are locked into place. Ask someone to hold it for you, if possible. Clearly mark and make sure that you can see: Any grab bars or handrails. First and last steps. Where the edge of each step is. Use tools that help you move around (mobility aids) if they are needed. These include: Canes. Walkers. Scooters. Crutches. Turn on the lights when you go into a dark area. Replace any light bulbs as soon as they burn out. Set up your furniture so you have a clear path. Avoid moving your furniture around. If any of your floors are uneven, fix them. If there are any pets around you, be aware of where they are. Review your medicines with your doctor. Some medicines can make you feel dizzy. This can increase your chance of falling. Ask your doctor what other things that you can do to help prevent falls. This information is not intended to replace advice given to you by your health care provider. Make sure you discuss any questions you have with your health care provider. Document Released: 06/12/2009 Document Revised: 01/22/2016 Document Reviewed: 09/20/2014 Elsevier Interactive Patient Education  2017 [employer].

## 2022-11-07 NOTE — Patient Instructions (Signed)

## 2022-11-12 ENCOUNTER — Encounter: Payer: Self-pay | Admitting: Nurse Practitioner

## 2022-11-12 ENCOUNTER — Ambulatory Visit (INDEPENDENT_AMBULATORY_CARE_PROVIDER_SITE_OTHER): Payer: Medicare PPO | Admitting: Nurse Practitioner

## 2022-11-12 VITALS — BP 130/70 | HR 57 | Temp 97.7°F | Ht 60.0 in | Wt 138.7 lb

## 2022-11-12 DIAGNOSIS — E7800 Pure hypercholesterolemia, unspecified: Secondary | ICD-10-CM | POA: Diagnosis not present

## 2022-11-12 DIAGNOSIS — R748 Abnormal levels of other serum enzymes: Secondary | ICD-10-CM | POA: Diagnosis not present

## 2022-11-12 DIAGNOSIS — I10 Essential (primary) hypertension: Secondary | ICD-10-CM | POA: Diagnosis not present

## 2022-11-12 DIAGNOSIS — G40109 Localization-related (focal) (partial) symptomatic epilepsy and epileptic syndromes with simple partial seizures, not intractable, without status epilepticus: Secondary | ICD-10-CM

## 2022-11-12 DIAGNOSIS — K219 Gastro-esophageal reflux disease without esophagitis: Secondary | ICD-10-CM | POA: Diagnosis not present

## 2022-11-12 NOTE — Assessment & Plan Note (Signed)
Chronic, stable on Keppra.  No recent seizure activity.  CMP and Keppra level today.  Refills up to date.

## 2022-11-12 NOTE — Assessment & Plan Note (Signed)
History of gall bladder removal, overall no symptoms.  Monitor only at this time.

## 2022-11-12 NOTE — Assessment & Plan Note (Signed)
Chronic, stable with recent OTC Pepcid.  Mag level today.  Continue current regimen and adjust as needed.

## 2022-11-12 NOTE — Assessment & Plan Note (Signed)
Chronic, ongoing.  Continue current medication regimen and adjust as needed.  Lipid panel today.  Refills up to date.  

## 2022-11-12 NOTE — Progress Notes (Signed)
BP 130/70   Pulse (!) 57   Temp 97.7 F (36.5 C) (Oral)   Ht 5' (1.524 m)   Wt 138 lb 11.2 oz (62.9 kg)   LMP  (LMP Unknown)   SpO2 98%   BMI 27.09 kg/m    Subjective:    Patient ID: Renee Lopez, female    DOB: [birth year], 82 y.o.   MRN: [id number]  HPI: Renee Lopez is a 82 y.o. female  Chief Complaint  Patient presents with   Hypertension   Hyperlipidemia   Seizures   HYPERTENSION / HYPERLIPIDEMIA Continues on ASA, Lipitor, and Micardis. [place], [place] and chickens that she cares for daily -- stays very active.  Has history of gall bladder removal in 2007, labs have noted elevation in alk phos and GGT. Satisfied with current treatment? yes Duration of hypertension: chronic BP monitoring frequency: daily BP range: 115/72 to 127/79 = HR 70 range BP medication side effects: no Duration of hyperlipidemia: chronic Cholesterol medication side effects: no Cholesterol supplements: none Medication compliance: good compliance Aspirin: yes Recent stressors: no Recurrent headaches: no Visual changes: no Palpitations: no Dyspnea: no Chest pain: no Lower extremity edema: no Dizzy/lightheaded: no    SEIZURE DISORDER: Continues on Keppra 500 MG BID.  No recent seizures reported.  Denies abdominal pain, N&V, SOB, or CP.  Has not had a seizure in many years, not since [place].  Only has history of 3 seizures.  GERD Taking Pepcid 40 MG every evening, she reports this helps keep heart burn away. GERD control status: stable  Satisfied with current treatment? yes Heartburn frequency:  occasional Medication side effects: no  Medication compliance: stable Previous GERD medications: none Antacid use frequency: rarely uses TUMS Dysphagia: no Odynophagia:  no Hematemesis: no Blood in stool: no EGD: no  Relevant past medical, surgical, family and social history reviewed and updated as indicated. Interim medical history since our last visit  reviewed. Allergies and medications reviewed and updated.  Review of Systems  Constitutional:  Negative for activity change, appetite change, diaphoresis, fatigue and fever.  Respiratory:  Negative for cough, chest tightness and shortness of breath.   Cardiovascular:  Negative for chest pain, palpitations and leg swelling.  Gastrointestinal: Negative.   Neurological:  Negative for dizziness, seizures, syncope, weakness, light-headedness, numbness and headaches.  Psychiatric/Behavioral: Negative.      Per HPI unless specifically indicated above     Objective:    BP 130/70   Pulse (!) 57   Temp 97.7 F (36.5 C) (Oral)   Ht 5' (1.524 m)   Wt 138 lb 11.2 oz (62.9 kg)   LMP  (LMP Unknown)   SpO2 98%   BMI 27.09 kg/m   Wt Readings from Last 3 Encounters:  11/12/22 138 lb 11.2 oz (62.9 kg)  05/14/22 135 lb 11.2 oz (61.6 kg)  10/28/21 139 lb 3.2 oz (63.1 kg)    Physical Exam Vitals and nursing note reviewed.  Constitutional:      General: She is awake. She is not in acute distress.    Appearance: She is well-developed and well-groomed. She is not ill-appearing.  HENT:     Head: Normocephalic.     Right Ear: Hearing normal.     Left Ear: Hearing normal.  Eyes:     General: Lids are normal.        Right eye: No discharge.        Left eye: No discharge.     Conjunctiva/sclera:  Conjunctivae normal.     Pupils: Pupils are equal, round, and reactive to light.  Neck:     Thyroid: No thyromegaly.     Vascular: No carotid bruit.  Cardiovascular:     Rate and Rhythm: Normal rate and regular rhythm.     Heart sounds: Normal heart sounds. No murmur heard.    No gallop.  Pulmonary:     Effort: Pulmonary effort is normal. No accessory muscle usage or respiratory distress.     Breath sounds: Normal breath sounds.  Abdominal:     General: Bowel sounds are normal.     Palpations: Abdomen is soft.  Musculoskeletal:     Cervical back: Normal range of motion and neck supple.      Right lower leg: No edema.     Left lower leg: No edema.  Skin:    General: Skin is warm and dry.  Neurological:     Mental Status: She is alert and oriented to person, place, and time.  Psychiatric:        Attention and Perception: Attention normal.        Mood and Affect: Mood normal.        Speech: Speech normal.        Behavior: Behavior normal. Behavior is cooperative.        Thought Content: Thought content normal.    Results for orders placed or performed in visit on 05/14/22  Gamma GT  Result Value Ref Range   GGT 102 (H) 0 - 60 IU/L  Comprehensive metabolic panel  Result Value Ref Range   Glucose 98 70 - 99 mg/dL   BUN 16 8 - 27 mg/dL   Creatinine, Ser 0.90 0.57 - 1.00 mg/dL   eGFR 63 >59 mL/min/1.73   BUN/Creatinine Ratio 18 12 - 28   Sodium 143 134 - 144 mmol/L   Potassium 4.5 3.5 - 5.2 mmol/L   Chloride 104 96 - 106 mmol/L   CO2 26 20 - 29 mmol/L   Calcium 9.5 8.7 - 10.3 mg/dL   Total Protein 6.4 6.0 - 8.5 g/dL   Albumin 4.1 3.7 - 4.7 g/dL   Globulin, Total 2.3 1.5 - 4.5 g/dL   Albumin/Globulin Ratio 1.8 1.2 - 2.2   Bilirubin Total 0.9 0.0 - 1.2 mg/dL   Alkaline Phosphatase 145 (H) 44 - 121 IU/L   AST 20 0 - 40 IU/L   ALT 13 0 - 32 IU/L  Lipid Panel w/o Chol/HDL Ratio  Result Value Ref Range   Cholesterol, Total 126 100 - 199 mg/dL   Triglycerides 62 0 - 149 mg/dL   HDL 38 (L) >39 mg/dL   VLDL Cholesterol Cal 13 5 - 40 mg/dL   LDL Chol Calc (NIH) 75 0 - 99 mg/dL  CBC with Differential/Platelet  Result Value Ref Range   WBC 4.1 3.4 - 10.8 x10E3/uL   RBC 4.55 3.77 - 5.28 x10E6/uL   Hemoglobin 13.3 11.1 - 15.9 g/dL   Hematocrit 42.7 34.0 - 46.6 %   MCV 94 79 - 97 fL   MCH 29.2 26.6 - 33.0 pg   MCHC 31.1 (L) 31.5 - 35.7 g/dL   RDW 13.0 11.7 - 15.4 %   Platelets 245 150 - 450 x10E3/uL   Neutrophils 53 Not Estab. %   Lymphs 30 Not Estab. %   Monocytes 10 Not Estab. %   Eos 6 Not Estab. %   Basos 1 Not Estab. %   Neutrophils Absolute 2.2 1.4 -  7.0  x10E3/uL   Lymphocytes Absolute 1.3 0.7 - 3.1 x10E3/uL   Monocytes Absolute 0.4 0.1 - 0.9 x10E3/uL   EOS (ABSOLUTE) 0.3 0.0 - 0.4 x10E3/uL   Basophils Absolute 0.0 0.0 - 0.2 x10E3/uL   Immature Granulocytes 0 Not Estab. %   Immature Grans (Abs) 0.0 0.0 - 0.1 x10E3/uL      Assessment & Plan:   Problem List Items Addressed This Visit       Cardiovascular and Mediastinum   Essential hypertension    Chronic, stable.  BP at goal in office and at home for her age.  Continue current medication regimen and adjust as needed.  LABS: CMP, TSH, Lipid.  Recommend continued focus on DASH diet at home and regular BP checks.  Refills up to date. Return in 6 months.      Relevant Orders   Comprehensive metabolic panel   TSH     Digestive   GERD (gastroesophageal reflux disease)    Chronic, stable with recent OTC Pepcid.  Mag level today.  Continue current regimen and adjust as needed.        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  No recent seizure activity.  CMP and Keppra level today.  Refills up to date.      Relevant Orders   Levetiracetam level     Other   Elevated alkaline phosphatase level    History of gall bladder removal, overall no symptoms.  Monitor only at this time.      Relevant Orders   Comprehensive metabolic panel   Gamma GT   Hyperlipidemia    Chronic, ongoing.  Continue current medication regimen and adjust as needed.  Lipid panel today.  Refills up to date.      Relevant Orders   Comprehensive metabolic panel   Lipid Panel w/o Chol/HDL Ratio     Follow up plan: Return in about 6 months (around 05/15/2023) for HTN/HLD, SEIZURES.

## 2022-11-12 NOTE — Assessment & Plan Note (Signed)
Chronic, stable.  BP at goal in office and at home for her age.  Continue current medication regimen and adjust as needed.  LABS: CMP, TSH, Lipid.  Recommend continued focus on DASH diet at home and regular BP checks.  Refills up to date. Return in 6 months.

## 2022-11-13 NOTE — Progress Notes (Signed)
Good morning, please let Renee Lopez know her labs have returned and overall they continue to look fantastic.  I am waiting on her Keppra level and if abnormal we will let her know.  At this time continue all current medications!!  You are doing great!!  Any questions? Keep being wonderful!!  Thank you for allowing me to participate in your care.  I appreciate you. Kindest regards, Cheyla Duchemin

## 2022-11-15 LAB — COMPREHENSIVE METABOLIC PANEL
ALT: 13 IU/L (ref 0–32)
AST: 21 IU/L (ref 0–40)
Albumin/Globulin Ratio: 1.7 (ref 1.2–2.2)
Albumin: 4.3 g/dL (ref 3.7–4.7)
Alkaline Phosphatase: 115 IU/L (ref 44–121)
BUN/Creatinine Ratio: 19 (ref 12–28)
BUN: 17 mg/dL (ref 8–27)
Bilirubin Total: 0.6 mg/dL (ref 0.0–1.2)
CO2: 23 mmol/L (ref 20–29)
Calcium: 9.6 mg/dL (ref 8.7–10.3)
Chloride: 106 mmol/L (ref 96–106)
Creatinine, Ser: 0.91 mg/dL (ref 0.57–1.00)
Globulin, Total: 2.5 g/dL (ref 1.5–4.5)
Glucose: 95 mg/dL (ref 70–99)
Potassium: 4.1 mmol/L (ref 3.5–5.2)
Sodium: 142 mmol/L (ref 134–144)
Total Protein: 6.8 g/dL (ref 6.0–8.5)
eGFR: 63 mL/min/{1.73_m2} (ref >59)

## 2022-11-15 LAB — LIPID PANEL W/O CHOL/HDL RATIO
Cholesterol, Total: 139 mg/dL (ref 100–199)
HDL: 38 mg/dL — ABNORMAL LOW (ref >39)
LDL Chol Calc (NIH): 87 mg/dL (ref 0–99)
Triglycerides: 71 mg/dL (ref 0–149)
VLDL Cholesterol Cal: 14 mg/dL (ref 5–40)

## 2022-11-15 LAB — TSH: TSH: 1.84 u[IU]/mL (ref 0.450–4.500)

## 2022-11-15 LAB — MAGNESIUM: Magnesium: 2.2 mg/dL (ref 1.6–2.3)

## 2022-11-15 LAB — LEVETIRACETAM LEVEL: Levetiracetam Lvl: 13.1 ug/mL (ref 10.0–40.0)

## 2022-11-15 LAB — GAMMA GT: GGT: 53 IU/L (ref 0–60)

## 2023-01-25 DIAGNOSIS — Z961 Presence of intraocular lens: Secondary | ICD-10-CM | POA: Diagnosis not present

## 2023-05-15 NOTE — Patient Instructions (Signed)
Be Involved in Caring For Your Health:  Taking Medications When medications are taken as directed, they can greatly improve your health. But if they are not taken as prescribed, they may not work. In some cases, not taking them correctly can be harmful. To help ensure your treatment remains effective and safe, understand your medications and how to take them. Bring your medications to each visit for review by your provider.  Your lab results, notes, and after visit summary will be available on My Chart. We strongly encourage you to use this feature. If lab results are abnormal the clinic will contact you with the appropriate steps. If the clinic does not contact you assume the results are satisfactory. You can always view your results on My Chart. If you have questions regarding your health or results, please contact the clinic during office hours. You can also ask questions on My Chart.  We at [facility] are grateful that you chose [place] to provide your care. We strive to provide evidence-based and compassionate care and are always looking for feedback. If you get a survey from the clinic please complete this so we can hear your opinions.  DASH Eating Plan DASH stands for Dietary Approaches to Stop Hypertension. The DASH eating plan is a healthy eating plan that has been shown to: Lower high blood pressure (hypertension). Reduce your risk for type 2 diabetes, heart disease, and stroke. Help with weight loss. What are tips for following this plan? Reading food labels Check food labels for the amount of salt (sodium) per serving. Choose foods with less than 5 percent of the Daily Value (DV) of sodium. In general, foods with less than 300 milligrams (mg) of sodium per serving fit into this eating plan. To find whole grains, look for the word "whole" as the first word in the ingredient list. Shopping Buy products labeled as "low-sodium" or "no salt added." Buy fresh foods. Avoid canned  foods and pre-made or frozen meals. Cooking Try not to add salt when you cook. Use salt-free seasonings or herbs instead of table salt or sea salt. Check with your health care provider or pharmacist before using salt substitutes. Do not fry foods. Cook foods in healthy ways, such as baking, boiling, grilling, roasting, or broiling. Cook using oils that are good for your heart. These include olive, canola, avocado, soybean, and sunflower oil. Meal planning  Eat a balanced diet. This should include: 4 or more servings of fruits and 4 or more servings of vegetables each day. Try to fill half of your plate with fruits and vegetables. 6-8 servings of whole grains each day. 6 or less servings of lean meat, poultry, or fish each day. 1 oz is 1 serving. A 3 oz (85 g) serving of meat is about the same size as the palm of your hand. One egg is 1 oz (28 g). 2-3 servings of low-fat dairy each day. One serving is 1 cup (237 mL). 1 serving of nuts, seeds, or beans 5 times each week. 2-3 servings of heart-healthy fats. Healthy fats called omega-3 fatty acids are found in foods such as walnuts, flaxseeds, fortified milks, and eggs. These fats are also found in cold-water fish, such as sardines, salmon, and mackerel. Limit how much you eat of: Canned or prepackaged foods. Food that is high in trans fat, such as fried foods. Food that is high in saturated fat, such as fatty meat. Desserts and other sweets, sugary drinks, and other foods with added sugar. Full-fat  dairy products. Do not salt foods before eating. Do not eat more than 4 egg yolks a week. Try to eat at least 2 vegetarian meals a week. Eat more home-cooked food and less restaurant, buffet, and fast food. Lifestyle When eating at a restaurant, ask if your food can be made with less salt or no salt. If you drink alcohol: Limit how much you have to: 0-1 drink a day if you are female. 0-2 drinks a day if you are female. Know how much alcohol is in  your drink. In the [place] one drink is one 12 oz bottle of beer (355 mL), one 5 oz glass of wine (148 mL), or one 1 oz glass of hard liquor (44 mL). General information Avoid eating more than 2,300 mg of salt a day. If you have hypertension, you may need to reduce your sodium intake to 1,500 mg a day. Work with your provider to stay at a healthy body weight or lose weight. Ask what the best weight range is for you. On most days of the week, get at least 30 minutes of exercise that causes your heart to beat faster. This may include walking, swimming, or biking. Work with your provider or dietitian to adjust your eating plan to meet your specific calorie needs. What foods should I eat? Fruits All fresh, dried, or frozen fruit. Canned fruits that are in their natural juice and do not have sugar added to them. Vegetables Fresh or frozen vegetables that are raw, steamed, roasted, or grilled. Low-sodium or reduced-sodium tomato and vegetable juice. Low-sodium or reduced-sodium tomato sauce and tomato paste. Low-sodium or reduced-sodium canned vegetables. Grains Whole-grain or whole-wheat bread. Whole-grain or whole-wheat pasta. Brown rice. Orpah Cobb. Bulgur. Whole-grain and low-sodium cereals. Pita bread. Low-fat, low-sodium crackers. Whole-wheat flour tortillas. Meats and other proteins Skinless chicken or [place]. Ground chicken or [place]. Pork with fat trimmed off. Fish and seafood. Egg whites. Dried beans, peas, or lentils. Unsalted nuts, nut butters, and seeds. Unsalted canned beans. Lean cuts of beef with fat trimmed off. Low-sodium, lean precooked or cured meat, such as sausages or meat loaves. Dairy Low-fat (1%) or fat-free (skim) milk. Reduced-fat, low-fat, or fat-free cheeses. Nonfat, low-sodium ricotta or cottage cheese. Low-fat or nonfat yogurt. Low-fat, low-sodium cheese. Fats and oils Soft margarine without trans fats. Vegetable oil. Reduced-fat, low-fat, or light mayonnaise and salad  dressings (reduced-sodium). Canola, safflower, olive, avocado, soybean, and sunflower oils. Avocado. Seasonings and condiments Herbs. Spices. Seasoning mixes without salt. Other foods Unsalted popcorn and pretzels. Fat-free sweets. The items listed above may not be all the foods and drinks you can have. Talk to a dietitian to learn more. What foods should I avoid? Fruits Canned fruit in a light or heavy syrup. Fried fruit. Fruit in cream or butter sauce. Vegetables Creamed or fried vegetables. Vegetables in a cheese sauce. Regular canned vegetables that are not marked as low-sodium or reduced-sodium. Regular canned tomato sauce and paste that are not marked as low-sodium or reduced-sodium. Regular tomato and vegetable juices that are not marked as low-sodium or reduced-sodium. Rosita Fire. Olives. Grains Baked goods made with fat, such as croissants, muffins, or some breads. Dry pasta or rice meal packs. Meats and other proteins Fatty cuts of meat. Ribs. Fried meat. Tomasa Blase. Bologna, salami, and other precooked or cured meats, such as sausages or meat loaves, that are not lean and low in sodium. Fat from the back of a pig (fatback). Bratwurst. Salted nuts and seeds. Canned beans with added salt. Canned  or smoked fish. Whole eggs or egg yolks. Chicken or [place] with skin. Dairy Whole or 2% milk, cream, and half-and-half. Whole or full-fat cream cheese. Whole-fat or sweetened yogurt. Full-fat cheese. Nondairy creamers. Whipped toppings. Processed cheese and cheese spreads. Fats and oils Butter. Stick margarine. Lard. Shortening. Ghee. Bacon fat. Tropical oils, such as coconut, palm kernel, or palm oil. Seasonings and condiments Onion salt, garlic salt, seasoned salt, table salt, and sea salt. Worcestershire sauce. Tartar sauce. Barbecue sauce. Teriyaki sauce. Soy sauce, including reduced-sodium soy sauce. Steak sauce. Canned and packaged gravies. Fish sauce. Oyster sauce. Cocktail sauce. Store-bought  horseradish. Ketchup. Mustard. Meat flavorings and tenderizers. Bouillon cubes. Hot sauces. Pre-made or packaged marinades. Pre-made or packaged taco seasonings. Relishes. Regular salad dressings. Other foods Salted popcorn and pretzels. The items listed above may not be all the foods and drinks you should avoid. Talk to a dietitian to learn more. Where to find more information National Heart, Lung, and Blood Institute (NHLBI): BuffaloDryCleaner.gl American Heart Association (AHA): heart.org Academy of Nutrition and Dietetics: eatright.org National Kidney Foundation (NKF): kidney.org This information is not intended to replace advice given to you by your health care provider. Make sure you discuss any questions you have with your health care provider. Document Revised: 09/02/2022 Document Reviewed: 09/02/2022 Elsevier Patient Education  2024 [employer].

## 2023-05-17 ENCOUNTER — Encounter: Payer: Self-pay | Admitting: Nurse Practitioner

## 2023-05-17 ENCOUNTER — Ambulatory Visit (INDEPENDENT_AMBULATORY_CARE_PROVIDER_SITE_OTHER): Payer: Medicare PPO | Admitting: Nurse Practitioner

## 2023-05-17 VITALS — BP 114/72 | HR 54 | Temp 97.9°F | Ht 60.0 in | Wt 133.0 lb

## 2023-05-17 DIAGNOSIS — Z23 Encounter for immunization: Secondary | ICD-10-CM

## 2023-05-17 DIAGNOSIS — M159 Polyosteoarthritis, unspecified: Secondary | ICD-10-CM | POA: Diagnosis not present

## 2023-05-17 DIAGNOSIS — G40109 Localization-related (focal) (partial) symptomatic epilepsy and epileptic syndromes with simple partial seizures, not intractable, without status epilepticus: Secondary | ICD-10-CM | POA: Diagnosis not present

## 2023-05-17 DIAGNOSIS — I10 Essential (primary) hypertension: Secondary | ICD-10-CM | POA: Diagnosis not present

## 2023-05-17 DIAGNOSIS — E7800 Pure hypercholesterolemia, unspecified: Secondary | ICD-10-CM

## 2023-05-17 MED ORDER — GABAPENTIN 100 MG PO CAPS: 100.0000 mg | ORAL_CAPSULE | Freq: Every evening | ORAL | 1 refills | Status: DC | PRN

## 2023-05-17 MED ORDER — LEVETIRACETAM 500 MG PO TABS: 500.0000 mg | ORAL_TABLET | Freq: Two times a day (BID) | ORAL | 4 refills | Status: DC

## 2023-05-17 MED ORDER — TELMISARTAN 80 MG PO TABS: 80.0000 mg | ORAL_TABLET | Freq: Every day | ORAL | 4 refills | Status: DC

## 2023-05-17 MED ORDER — ATORVASTATIN CALCIUM 10 MG PO TABS: 10.0000 mg | ORAL_TABLET | Freq: Every day | ORAL | 4 refills | Status: DC

## 2023-05-17 MED ORDER — FAMOTIDINE 40 MG PO TABS: ORAL_TABLET | ORAL | 4 refills | Status: DC

## 2023-05-17 NOTE — Assessment & Plan Note (Signed)
Chronic, ongoing.  Continue current medication regimen and adjust as needed.  Lipid panel today.  Refills up to date.

## 2023-05-17 NOTE — Progress Notes (Signed)
BP 114/72 (BP Location: Left Arm, Patient Position: Sitting, Cuff Size: Normal)   Pulse (!) 54   Temp 97.9 F (36.6 C) (Oral)   Ht 5' (1.524 m)   Wt 133 lb (60.3 kg)   LMP  (LMP Unknown)   SpO2 97%   BMI 25.97 kg/m    Subjective:    Patient ID: Renee Lopez, female    DOB: 1941/08/20, 82 y.o.   MRN: [id number]  HPI: Renee Lopez is a 82 y.o. female  Chief Complaint  Patient presents with   Seizures   Hyperlipidemia   Hypertension   Daughters x 2 at bedside to assist with HPI.  HYPERTENSION / HYPERLIPIDEMIA Continues on ASA, Lipitor, and Telmisartan. [place], cares for cows and chickens daily -- stays very active at baseline.  Has history of gall bladder removal in 2007, labs have noted elevation in alk phos and GGT. Satisfied with current treatment? yes Duration of hypertension: chronic BP monitoring frequency: daily BP range: 114/71 to 119/71 = HR 60 range BP medication side effects: no Duration of hyperlipidemia: chronic Cholesterol medication side effects: no Cholesterol supplements: none Medication compliance: good compliance Aspirin: yes Recent stressors: no Recurrent headaches: no Visual changes: no Palpitations: no Dyspnea: no Chest pain: no Lower extremity edema: sometimes with arthritis gets swelling Dizzy/lightheaded: no   ARTHRALGIAS / JOINT ACHES At baseline has arthritic pain, takes Tylenol as needed.  Currently is having more numbness down from left side neck to fingers with numbness.  Her daughter is physical therapist, does therapy with her at times. Duration: weeks Pain: yes Symmetric: yes  8/10 Quality: dull and aching and numbness Frequency: constant Context:  fluctuating Decreased function/range of motion: no Erythema: no Swelling: occasional to left ankle Heat or warmth: no Morning stiffness: yes at baseline Aggravating factors: movement Alleviating factors: Tylenol Relief with NSAIDs?: No  NSAIDs Taken Treatments attempted:  Tylenol + Voltaren gel  Involved Joints:     Hands: yes left    Wrists: yes left     Elbows: no    Shoulders: yes bilateral    Back: no     Hips: no    Knees: yes bilateral    Ankles: yes bilateral    Feet: yes bilateral    SEIZURE DISORDER: Continues on Keppra 500 MG BID.  No recent seizures reported.  Denies abdominal pain, N&V, SOB, or CP.  Has not had a seizure in many years, not since [place].  Only has history of 3 seizures.  Relevant past medical, surgical, family and social history reviewed and updated as indicated. Interim medical history since our last visit reviewed. Allergies and medications reviewed and updated.  Review of Systems  Constitutional:  Negative for activity change, appetite change, diaphoresis, fatigue and fever.  Respiratory:  Negative for cough, chest tightness and shortness of breath.   Cardiovascular:  Negative for chest pain, palpitations and leg swelling.  Gastrointestinal: Negative.   Neurological:  Negative for dizziness, seizures, syncope, weakness, light-headedness, numbness and headaches.  Psychiatric/Behavioral: Negative.      Per HPI unless specifically indicated above     Objective:    BP 114/72 (BP Location: Left Arm, Patient Position: Sitting, Cuff Size: Normal)   Pulse (!) 54   Temp 97.9 F (36.6 C) (Oral)   Ht 5' (1.524 m)   Wt 133 lb (60.3 kg)   LMP  (LMP Unknown)   SpO2 97%   BMI 25.97 kg/m   Wt Readings  from Last 3 Encounters:  05/17/23 133 lb (60.3 kg)  11/12/22 138 lb 11.2 oz (62.9 kg)  05/14/22 135 lb 11.2 oz (61.6 kg)    Physical Exam Vitals and nursing note reviewed.  Constitutional:      General: She is awake. She is not in acute distress.    Appearance: She is well-developed and well-groomed. She is not ill-appearing.  HENT:     Head: Normocephalic.     Right Ear: Hearing normal.     Left Ear: Hearing normal.  Eyes:     General: Lids are normal.        Right eye: No  discharge.        Left eye: No discharge.     Conjunctiva/sclera: Conjunctivae normal.     Pupils: Pupils are equal, round, and reactive to light.  Neck:     Thyroid: No thyromegaly.     Vascular: No carotid bruit.  Cardiovascular:     Rate and Rhythm: Normal rate and regular rhythm.     Heart sounds: Normal heart sounds. No murmur heard.    No gallop.  Pulmonary:     Effort: Pulmonary effort is normal. No accessory muscle usage or respiratory distress.     Breath sounds: Normal breath sounds.  Abdominal:     General: Bowel sounds are normal.     Palpations: Abdomen is soft.  Musculoskeletal:     Right shoulder: No swelling, tenderness or crepitus. Normal range of motion. Normal strength.     Left shoulder: No swelling, tenderness or crepitus. Normal range of motion. Normal strength.     Right hand: No swelling or tenderness. Normal range of motion. Normal strength. Normal pulse.     Left hand: No swelling or tenderness. Normal range of motion. Normal strength. Normal pulse.     Cervical back: Normal range of motion and neck supple.     Right lower leg: No edema.     Left lower leg: No edema.  Skin:    General: Skin is warm and dry.  Neurological:     Mental Status: She is alert and oriented to person, place, and time.  Psychiatric:        Attention and Perception: Attention normal.        Mood and Affect: Mood normal.        Speech: Speech normal.        Behavior: Behavior normal. Behavior is cooperative.        Thought Content: Thought content normal.    Results for orders placed or performed in visit on 11/12/22  Comprehensive metabolic panel  Result Value Ref Range   Glucose 95 70 - 99 mg/dL   BUN 17 8 - 27 mg/dL   Creatinine, Ser 8.11 0.57 - 1.00 mg/dL   eGFR 63 >91 YN/WGN/5.62   BUN/Creatinine Ratio 19 12 - 28   Sodium 142 134 - 144 mmol/L   Potassium 4.1 3.5 - 5.2 mmol/L   Chloride 106 96 - 106 mmol/L   CO2 23 20 - 29 mmol/L   Calcium 9.6 8.7 - 10.3 mg/dL    Total Protein 6.8 6.0 - 8.5 g/dL   Albumin 4.3 3.7 - 4.7 g/dL   Globulin, Total 2.5 1.5 - 4.5 g/dL   Albumin/Globulin Ratio 1.7 1.2 - 2.2   Bilirubin Total 0.6 0.0 - 1.2 mg/dL   Alkaline Phosphatase 115 44 - 121 IU/L   AST 21 0 - 40 IU/L   ALT 13 0 - 32 IU/L  Lipid Panel w/o Chol/HDL Ratio  Result Value Ref Range   Cholesterol, Total 139 100 - 199 mg/dL   Triglycerides 71 0 - 149 mg/dL   HDL 38 (L) >62 mg/dL   VLDL Cholesterol Cal 14 5 - 40 mg/dL   LDL Chol Calc (NIH) 87 0 - 99 mg/dL  TSH  Result Value Ref Range   TSH 1.840 0.450 - 4.500 uIU/mL  Gamma GT  Result Value Ref Range   GGT 53 0 - 60 IU/L  Magnesium  Result Value Ref Range   Magnesium 2.2 1.6 - 2.3 mg/dL  Levetiracetam level  Result Value Ref Range   Levetiracetam Lvl 13.1 10.0 - 40.0 ug/mL      Assessment & Plan:   Problem List Items Addressed This Visit       Cardiovascular and Mediastinum   Essential hypertension    Chronic, stable.  BP at goal in office on recheck and at home for her age.  Continue current medication regimen and adjust as needed.  LABS: CMP.  Recommend continued focus on DASH diet at home and regular BP checks.  Refills up to date. Return in 6 months.      Relevant Medications   atorvastatin (LIPITOR) 10 MG tablet   telmisartan (MICARDIS) 80 MG tablet   Other Relevant Orders   Comprehensive metabolic panel     Nervous and Auditory   Focal epilepsy (HCC) - Primary    Chronic, stable on Keppra.  No recent seizure activity.  CMP today.  Refills up to date.      Relevant Medications   gabapentin (NEURONTIN) 100 MG capsule   levETIRAcetam (KEPPRA) 500 MG tablet   Other Relevant Orders   Comprehensive metabolic panel     Musculoskeletal and Integument   Osteoarthritis    Chronic, stable.  Recommend use of OTC Voltaren gel + Tylenol PRN and continue daily gentle stretching and activity.  May also rotate between heat/ice for discomfort + wear knee braces.  Does not want further  injections, had some in past.  For current shoulder discomfort left side with numbness to fingertips, recommend daily PT stretches + will start very low dose of Gabapentin 100 MG at night for this discomfort.  Educated her family and her on this, discussed if makes her too sleepy or off balance then stop taking.  Would avoid Meloxicam or Celecoxib due to age and HTN.          Other   Hyperlipidemia    Chronic, ongoing.  Continue current medication regimen and adjust as needed.  Lipid panel today.  Refills up to date.      Relevant Medications   atorvastatin (LIPITOR) 10 MG tablet   telmisartan (MICARDIS) 80 MG tablet   Other Relevant Orders   Comprehensive metabolic panel   Lipid Panel w/o Chol/HDL Ratio   Other Visit Diagnoses     Flu vaccine need       Flu vaccine due and provided to patient today, education given.        Follow up plan: Return in about 6 months (around 11/14/2023) for HTN/HLD, SEIZURE, ARTHRITIS -- Plus needs Medicare Well with nurse after 06/09/23.

## 2023-05-17 NOTE — Assessment & Plan Note (Signed)
Chronic, stable.  Recommend use of OTC Voltaren gel + Tylenol PRN and continue daily gentle stretching and activity.  May also rotate between heat/ice for discomfort + wear knee braces.  Does not want further injections, had some in past.  For current shoulder discomfort left side with numbness to fingertips, recommend daily PT stretches + will start very low dose of Gabapentin 100 MG at night for this discomfort.  Educated her family and her on this, discussed if makes her too sleepy or off balance then stop taking.  Would avoid Meloxicam or Celecoxib due to age and HTN.

## 2023-05-17 NOTE — Assessment & Plan Note (Signed)
Chronic, stable.  BP at goal in office on recheck and at home for her age.  Continue current medication regimen and adjust as needed.  LABS: CMP.  Recommend continued focus on DASH diet at home and regular BP checks.  Refills up to date. Return in 6 months.

## 2023-05-17 NOTE — Assessment & Plan Note (Signed)
Chronic, stable on Keppra.  No recent seizure activity.  CMP today.  Refills up to date.

## 2023-05-18 NOTE — Progress Notes (Signed)
Good afternoon, please let Jentri know her labs have returned and everything is stable still.  Great job!!  Any questions? Keep being amazing!!  Thank you for allowing me to participate in your care.  I appreciate you. Kindest regards, Casey Fye

## 2023-06-21 ENCOUNTER — Ambulatory Visit: Payer: Medicare PPO | Admitting: Emergency Medicine

## 2023-06-21 VITALS — Ht 62.0 in | Wt 135.0 lb

## 2023-06-21 DIAGNOSIS — Z0000 Encounter for general adult medical examination without abnormal findings: Secondary | ICD-10-CM

## 2023-06-21 NOTE — Progress Notes (Signed)
Subjective:   Renee Lopez is a 82 y.o. female who presents for Medicare Annual (Subsequent) preventive examination.  Visit Complete: Virtual I connected with  Maura Crandall on 06/21/23 by a audio enabled telemedicine application and verified that I am speaking with the correct person using two identifiers.  Patient Location: Home  Provider Location: Home Office  I discussed the limitations of evaluation and management by telemedicine. The patient expressed understanding and agreed to proceed.  Vital Signs: Because this visit was a virtual/telehealth visit, some criteria may be missing or patient reported. Any vitals not documented were not able to be obtained and vitals that have been documented are patient reported.   Cardiac Risk Factors include: advanced age (>70men, >8 women);dyslipidemia;hypertension     Objective:    Today's Vitals   06/21/23 1038  Weight: 135 lb (61.2 kg)  Height: 5\' 2"  (1.575 m)  PainSc: 5    Body mass index is 24.69 kg/m.     06/21/2023   10:48 AM 06/08/2022    9:34 AM 06/05/2021   10:30 AM 10/08/2020   10:59 AM 09/16/2020   10:08 AM 06/02/2020    9:49 AM 05/28/2019    9:38 AM  Advanced Directives  Does Patient Have a Medical Advance Directive? Yes Yes Yes Yes Yes Yes Yes  Type of Estate agent of [place];Living will Healthcare Power of [employer] of [place];Living will Healthcare Power of [place];Living will Healthcare Power of [place];Living will Healthcare Power of [place];Living will Living will;Healthcare Power of Attorney  Does patient want to make changes to medical advance directive? No - Patient declined   No - Patient declined No - Guardian declined    Copy of Healthcare Power of Attorney in Chart? Yes - validated most recent copy scanned in chart (See row information) Yes - validated most recent copy scanned in chart (See row information) Yes - validated most recent copy scanned in  chart (See row information) Yes - validated most recent copy scanned in chart (See row information) Yes - validated most recent copy scanned in chart (See row information) Yes - validated most recent copy scanned in chart (See row information) No - copy requested    Current Medications (verified) Outpatient Encounter Medications as of 06/21/2023  Medication Sig   aspirin 81 MG tablet Take 81 mg by mouth daily.   atorvastatin (LIPITOR) 10 MG tablet Take 1 tablet (10 mg total) by mouth daily at 6 PM.   famotidine (PEPCID) 40 MG tablet TAKE 1 TABLET BY MOUTH EVERY DAY IN THE EVENING   gabapentin (NEURONTIN) 100 MG capsule Take 1 capsule (100 mg total) by mouth at bedtime as needed (for joint pain -- neck/shoulder pain).   levETIRAcetam (KEPPRA) 500 MG tablet Take 1 tablet (500 mg total) by mouth 2 (two) times daily.   Multiple Vitamins-Minerals (MULTIVITAMIN WITH IRON-MINERALS) liquid Take by mouth daily.   telmisartan (MICARDIS) 80 MG tablet Take 1 tablet (80 mg total) by mouth daily.   No facility-administered encounter medications on file as of 06/21/2023.    Allergies (verified) Patient has no known allergies.   History: Past Medical History:  Diagnosis Date   Arthritis    Hyperlipidemia    Hypertension    Seizures (HCC)    Wears dentures    full upper and lower   Past Surgical History:  Procedure Laterality Date   CATARACT EXTRACTION W/PHACO Right 09/16/2020   Procedure: CATARACT EXTRACTION PHACO AND INTRAOCULAR LENS PLACEMENT (IOC) RIGHT 10.65  01:21.5 13.1%;  Surgeon: Lockie Mola, MD;  Location: [facility];  Service: Ophthalmology;  Laterality: Right;   CATARACT EXTRACTION W/PHACO Left 10/08/2020   Procedure: CATARACT EXTRACTION PHACO AND INTRAOCULAR LENS PLACEMENT (IOC) LEFT;  Surgeon: Lockie Mola, MD;  Location: [facility];  Service: Ophthalmology;  Laterality: Left;  6.69 1:15.9 8.8%   CHOLECYSTECTOMY     Family History  Problem Relation  Age of Onset   Seizures Mother    Hyperlipidemia Mother    Hypertension Mother    Cancer Father        colon   Hypertension Father    Social History   Socioeconomic History   Marital status: Married    Spouse name: Merlyn Albert   Number of children: 6   Years of education: Not on file   Highest education level: High school graduate  Occupational History   Occupation: retired  Tobacco Use   Smoking status: Never   Smokeless tobacco: Never  Vaping Use   Vaping status: Never Used  Substance and Sexual Activity   Alcohol use: No    Alcohol/week: 0.0 standard drinks of alcohol   Drug use: No   Sexual activity: Yes  Other Topics Concern   Not on file  Social History Narrative   6 children, 4 girls and 2 boys   Social Determinants of Health   Financial Resource Strain: Low Risk  (06/21/2023)   Overall Financial Resource Strain (CARDIA)    Difficulty of Paying Living Expenses: Not hard at all  Food Insecurity: No Food Insecurity (06/21/2023)   Hunger Vital Sign    Worried About Running Out of Food in the Last Year: Never true    Ran Out of Food in the Last Year: Never true  Transportation Needs: No Transportation Needs (06/21/2023)   PRAPARE - Administrator, Civil Service (Medical): No    Lack of Transportation (Non-Medical): No  Physical Activity: Insufficiently Active (06/21/2023)   Exercise Vital Sign    Days of Exercise per Week: 3 days    Minutes of Exercise per Session: 10 min  Stress: No Stress Concern Present (06/21/2023)   [employer] of Occupational Health - Occupational Stress Questionnaire    Feeling of Stress : Not at all  Social Connections: Moderately Integrated (06/21/2023)   Social Connection and Isolation Panel [NHANES]    Frequency of Communication with Friends and Family: More than three times a week    Frequency of Social Gatherings with Friends and Family: Twice a week    Attends Religious Services: More than 4 times per year    Active  Member of [employer] or Organizations: No    Attends Engineer, structural: Never    Marital Status: Married    Tobacco Counseling Counseling given: Not Answered   Clinical Intake:  Pre-visit preparation completed: Yes  Pain : 0-10 Pain Score: 5  Pain Type: Chronic pain Pain Location: Knee Pain Descriptors / Indicators: Aching     BMI - recorded: 24.69 Nutritional Status: BMI of 19-24  Normal Nutritional Risks: None Diabetes: No  How often do you need to have someone help you when you read instructions, pamphlets, or other written materials from your doctor or pharmacy?: 1 - Never  Interpreter Needed?: No  Information entered by :: Tora Kindred, CMA   Activities of Daily Living    06/21/2023   10:40 AM  In your present state of health, do you have any difficulty performing the following activities:  Hearing? 0  Vision? 0  Difficulty concentrating or making decisions? 0  Walking or climbing stairs? 0  Dressing or bathing? 0  Doing errands, shopping? 1  Comment doesn't drive, family drives to appointments  Preparing Food and eating ? N  Using the Toilet? N  In the past six months, have you accidently leaked urine? N  Do you have problems with loss of bowel control? N  Managing your Medications? N  Managing your Finances? N  Housekeeping or managing your Housekeeping? N    Patient Care Team: Marjie Skiff, NP as PCP - General (Nurse Practitioner)  Indicate any recent Medical Services you may have received from other than [facility] providers in the past year (date may be approximate).     Assessment:   This is a routine wellness examination for [place].  Hearing/Vision screen Hearing Screening - Comments:: Denies hearing loss Vision Screening - Comments:: Gets routine eye exams   Goals Addressed               This Visit's Progress     Patient Stated (pt-stated)        Try to exercise more      Depression Screen    06/21/2023   10:46 AM  05/17/2023    9:40 AM 11/12/2022    9:38 AM 06/08/2022    9:39 AM 05/14/2022   11:02 AM 10/28/2021   10:49 AM 06/05/2021   10:31 AM  PHQ 2/9 Scores  PHQ - 2 Score 0 0 0 0 0 0 0  PHQ- 9 Score 0 0 0 0 0 0     Fall Risk    06/21/2023   10:49 AM 05/17/2023    9:40 AM 11/12/2022    9:38 AM 06/08/2022    9:35 AM 05/14/2022   11:03 AM  Fall Risk   Falls in the past year? 0 0 0 0 0  Number falls in past yr: 0 0 0 0 0  Injury with Fall? 0 0 0 0 0  Risk for fall due to : No Fall Risks No Fall Risks No Fall Risks  No Fall Risks  Follow up Falls prevention discussed Falls evaluation completed Falls evaluation completed Falls evaluation completed;Education provided;Falls prevention discussed Falls evaluation completed    MEDICARE RISK AT HOME: Medicare Risk at Home Any stairs in or around the home?: Yes If so, are there any without handrails?: No Home free of loose throw rugs in walkways, pet beds, electrical cords, etc?: Yes Adequate lighting in your home to reduce risk of falls?: Yes Life alert?: No Use of a cane, walker or w/c?: No Grab bars in the bathroom?: No Shower chair or bench in shower?: No Elevated toilet seat or a handicapped toilet?: Yes  TIMED UP AND GO:  Was the test performed?  No    Cognitive Function:        06/21/2023   10:50 AM 06/08/2022    9:35 AM 06/05/2021   10:33 AM 06/02/2020    9:53 AM 05/26/2018   10:08 AM  6CIT Screen  What Year? 0 points 0 points 0 points 0 points 0 points  What month? 0 points 0 points 0 points 0 points 0 points  What time? 0 points 0 points 0 points 0 points 0 points  Count back from 20 0 points 0 points 0 points 0 points 0 points  Months in reverse 0 points 0 points 0 points 0 points 0 points  Repeat phrase 0 points 0 points 0 points 0  points 2 points  Total Score 0 points 0 points 0 points 0 points 2 points    Immunizations Immunization History  Administered Date(s) Administered   Fluad Quad(high Dose 65+) 06/19/2022    Influenza, High Dose Seasonal PF 06/24/2017, 06/07/2018, 06/19/2019, 06/14/2020, 06/13/2021, 05/21/2023   Influenza-Unspecified 06/09/2015, 06/10/2016, 06/21/2017   PFIZER(Purple Top)SARS-COV-2 Vaccination 11/15/2019, 12/11/2019, 07/13/2020   Pfizer Covid-19 Vaccine Bivalent Booster 22yrs & up 07/18/2021   Pneumococcal Conjugate-13 05/10/2014   Pneumococcal Polysaccharide-23 05/19/2016   Td 01/07/2014    TDAP status: Up to date  Flu Vaccine status: Up to date  Pneumococcal vaccine status: Up to date  Covid-19 vaccine status: Declined, Education has been provided regarding the importance of this vaccine but patient still declined. Advised may receive this vaccine at local pharmacy or Health Dept.or vaccine clinic. Aware to provide a copy of the vaccination record if obtained from local pharmacy or Health Dept. Verbalized acceptance and understanding.  Qualifies for Shingles Vaccine? Yes   Zostavax completed No   Shingrix Completed?: No.    Education has been provided regarding the importance of this vaccine. Patient has been advised to call insurance company to determine out of pocket expense if they have not yet received this vaccine. Advised may also receive vaccine at local pharmacy or Health Dept. Verbalized acceptance and understanding.  Screening Tests Health Maintenance  Topic Date Due   Zoster Vaccines- Shingrix (1 of 2) Never done   COVID-19 Vaccine (5 - 2023-24 season) 05/01/2023   DTaP/Tdap/Td (2 - Tdap) 01/08/2024   Medicare Annual Wellness (AWV)  06/20/2024   Pneumonia Vaccine 2+ Years old  Completed   INFLUENZA VACCINE  Completed   HPV VACCINES  Aged Out   DEXA SCAN  Discontinued    Health Maintenance  Health Maintenance Due  Topic Date Due   Zoster Vaccines- Shingrix (1 of 2) Never done   COVID-19 Vaccine (5 - 2023-24 season) 05/01/2023    Colorectal cancer screening: No longer required.   Mammogram status: No longer required due to age.  Bone Density  Status: Patient declined  Lung Cancer Screening: (Low Dose CT Chest recommended if Age 47-80 years, 20 pack-year currently smoking OR have quit w/in 15years.) does not qualify.   Lung Cancer Screening Referral: n/a  Additional Screening:  Hepatitis C Screening: does not qualify;   Vision Screening: Recommended annual ophthalmology exams for early detection of glaucoma and other disorders of the eye.  Dental Screening: Recommended annual dental exams for proper oral hygiene   Community Resource Referral / Chronic Care Management: CRR required this visit?  No   CCM required this visit?  No     Plan:     I have personally reviewed and noted the following in the patient's chart:   Medical and social history Use of alcohol, tobacco or illicit drugs  Current medications and supplements including opioid prescriptions. Patient is not currently taking opioid prescriptions. Functional ability and status Nutritional status Physical activity Advanced directives List of other physicians Hospitalizations, surgeries, and ER visits in previous 12 months Vitals Screenings to include cognitive, depression, and falls Referrals and appointments  In addition, I have reviewed and discussed with patient certain preventive protocols, quality metrics, and best practice recommendations. A written personalized care plan for preventive services as well as general preventive health recommendations were provided to patient.     Tora Kindred, CMA   06/21/2023   After Visit Summary: (Declined) Due to this being a telephonic visit, with patients personalized plan  was offered to patient but patient Declined AVS at this time   Nurse Notes:  Patient declined Covid and shingles vaccines. Patient declined DEXA scan

## 2023-06-21 NOTE — Patient Instructions (Addendum)
[name] , Thank you for taking time to come for your Medicare Wellness Visit. I appreciate your ongoing commitment to your health goals. Please review the following plan we discussed and let me know if I can assist you in the future.   Referrals/Orders/Follow-Ups/Clinician Recommendations: Keep up the good work.  This is a list of the screening recommended for you and due dates:  Health Maintenance  Topic Date Due   Zoster (Shingles) Vaccine (1 of 2) Never done   COVID-19 Vaccine (5 - 2023-24 season) 05/01/2023   DTaP/Tdap/Td vaccine (2 - Tdap) 01/08/2024   Medicare Annual Wellness Visit  06/20/2024   Pneumonia Vaccine  Completed   Flu Shot  Completed   HPV Vaccine  Aged Out   DEXA scan (bone density measurement)  Discontinued    Advanced directives: (In Chart) A copy of your advanced directives are scanned into your chart should your provider ever need it.  Next Medicare Annual Wellness Visit scheduled for next year: Yes, 06/26/24 @ 10:00am

## 2023-11-13 NOTE — Patient Instructions (Signed)
 Be Involved in Caring For Your Health:  Taking Medications When medications are taken as directed, they can greatly improve your health. But if they are not taken as prescribed, they may not work. In some cases, not taking them correctly can be harmful. To help ensure your treatment remains effective and safe, understand your medications and how to take them. Bring your medications to each visit for review by your provider.  Your lab results, notes, and after visit summary will be available on My Chart. We strongly encourage you to use this feature. If lab results are abnormal the clinic will contact you with the appropriate steps. If the clinic does not contact you assume the results are satisfactory. You can always view your results on My Chart. If you have questions regarding your health or results, please contact the clinic during office hours. You can also ask questions on My Chart.  We at [facility] are grateful that you chose [place] to provide your care. We strive to provide evidence-based and compassionate care and are always looking for feedback. If you get a survey from the clinic please complete this so we can hear your opinions.  Healthy Eating, Adult Healthy eating may help you get and keep a healthy body weight, reduce the risk of chronic disease, and live a long and productive life. It is important to follow a healthy eating pattern. Your nutritional and calorie needs should be met mainly by different nutrient-rich foods. What are tips for following this plan? Reading food labels Read labels and choose the following: Reduced or low sodium products. Juices with 100% fruit juice. Foods with low saturated fats (<3 g per serving) and high polyunsaturated and monounsaturated fats. Foods with whole grains, such as whole wheat, cracked wheat, brown rice, and wild rice. Whole grains that are fortified with folic acid. This is recommended for females who are pregnant or who want  to become pregnant. Read labels and do not eat or drink the following: Foods or drinks with added sugars. These include foods that contain brown sugar, corn sweetener, corn syrup, dextrose, fructose, glucose, high-fructose corn syrup, honey, invert sugar, lactose, malt syrup, maltose, molasses, raw sugar, sucrose, trehalose, or turbinado sugar. Limit your intake of added sugars to less than 10% of your total daily calories. Do not eat more than the following amounts of added sugar per day: 6 teaspoons (25 g) for females. 9 teaspoons (38 g) for males. Foods that contain processed or refined starches and grains. Refined grain products, such as white flour, degermed cornmeal, white bread, and white rice. Shopping Choose nutrient-rich snacks, such as vegetables, whole fruits, and nuts. Avoid high-calorie and high-sugar snacks, such as potato chips, fruit snacks, and candy. Use oil-based dressings and spreads on foods instead of solid fats such as butter, margarine, sour cream, or cream cheese. Limit pre-made sauces, mixes, and "instant" products such as flavored rice, instant noodles, and ready-made pasta. Try more plant-protein sources, such as tofu, tempeh, black beans, edamame, lentils, nuts, and seeds. Explore eating plans such as the Mediterranean diet or vegetarian diet. Try heart-healthy dips made with beans and healthy fats like hummus and guacamole. Vegetables go great with these. Cooking Use oil to saut or stir-fry foods instead of solid fats such as butter, margarine, or lard. Try baking, boiling, grilling, or broiling instead of frying. Remove the fatty part of meats before cooking. Steam vegetables in water or broth. Meal planning  At meals, imagine dividing your plate into fourths: One-half of  your plate is fruits and vegetables. One-fourth of your plate is whole grains. One-fourth of your plate is protein, especially lean meats, poultry, eggs, tofu, beans, or nuts. Include  low-fat dairy as part of your daily diet. Lifestyle Choose healthy options in all settings, including home, work, school, restaurants, or stores. Prepare your food safely: Wash your hands after handling raw meats. Where you prepare food, keep surfaces clean by regularly washing with hot, soapy water. Keep raw meats separate from ready-to-eat foods, such as fruits and vegetables. Cook seafood, meat, poultry, and eggs to the recommended temperature. Get a food thermometer. Store foods at safe temperatures. In general: Keep cold foods at 76F (4.4C) or below. Keep hot foods at 176F (60C) or above. Keep your freezer at [facility] (-17.8C) or below. Foods are not safe to eat if they have been between the temperatures of 40-176F (4.4-60C) for more than 2 hours. What foods should I eat? Fruits Aim to eat 1-2 cups of fresh, canned (in natural juice), or frozen fruits each day. One cup of fruit equals 1 small apple, 1 large banana, 8 large strawberries, 1 cup (237 g) canned fruit,  cup (82 g) dried fruit, or 1 cup (240 mL) 100% juice. Vegetables Aim to eat 2-4 cups of fresh and frozen vegetables each day, including different varieties and colors. One cup of vegetables equals 1 cup (91 g) broccoli or cauliflower florets, 2 medium carrots, 2 cups (150 g) raw, leafy greens, 1 large tomato, 1 large bell pepper, 1 large sweet potato, or 1 medium white potato. Grains Aim to eat 5-10 ounce-equivalents of whole grains each day. Examples of 1 ounce-equivalent of grains include 1 slice of bread, 1 cup (40 g) ready-to-eat cereal, 3 cups (24 g) popcorn, or  cup (93 g) cooked rice. Meats and other proteins Try to eat 5-7 ounce-equivalents of protein each day. Examples of 1 ounce-equivalent of protein include 1 egg,  oz nuts (12 almonds, 24 pistachios, or 7 walnut halves), 1/4 cup (90 g) cooked beans, 6 tablespoons (90 g) hummus or 1 tablespoon (16 g) peanut butter. A cut of meat or fish that is the size of a deck  of cards is about 3-4 ounce-equivalents (85 g). Of the protein you eat each week, try to have at least 8 sounce (227 g) of seafood. This is about 2 servings per week. This includes salmon, trout, herring, sardines, and anchovies. Dairy Aim to eat 3 cup-equivalents of fat-free or low-fat dairy each day. Examples of 1 cup-equivalent of dairy include 1 cup (240 mL) milk, 8 ounces (250 g) yogurt, 1 ounces (44 g) natural cheese, or 1 cup (240 mL) fortified soy milk. Fats and oils Aim for about 5 teaspoons (21 g) of fats and oils per day. Choose monounsaturated fats, such as canola and olive oils, mayonnaise made with olive oil or avocado oil, avocados, peanut butter, and most nuts, or polyunsaturated fats, such as sunflower, corn, and soybean oils, walnuts, pine nuts, sesame seeds, sunflower seeds, and flaxseed. Beverages Aim for 6 eight-ounce glasses of water per day. Limit coffee to 3-5 eight-ounce cups per day. Limit caffeinated beverages that have added calories, such as soda and energy drinks. If you drink alcohol: Limit how much you have to: 0-1 drink a day if you are female. 0-2 drinks a day if you are female. Know how much alcohol is in your drink. In the [place] one drink is one 12 oz bottle of beer (355 mL), one 5 oz glass of wine (  148 mL), or one 1 oz glass of hard liquor (44 mL). Seasoning and other foods Try not to add too much salt to your food. Try using herbs and spices instead of salt. Try not to add sugar to food. This information is based on [place] nutrition guidelines. To learn more, visit [employer]. Exact amounts may vary. You may need different amounts. This information is not intended to replace advice given to you by your health care provider. Make sure you discuss any questions you have with your health care provider. Document Revised: 05/17/2022 Document Reviewed: 05/17/2022 Elsevier Patient Education  2024 [employer].

## 2023-11-15 ENCOUNTER — Encounter: Payer: Self-pay | Admitting: Nurse Practitioner

## 2023-11-15 ENCOUNTER — Ambulatory Visit (INDEPENDENT_AMBULATORY_CARE_PROVIDER_SITE_OTHER): Payer: Medicare PPO | Admitting: Nurse Practitioner

## 2023-11-15 VITALS — BP 136/70 | HR 57 | Temp 97.5°F | Ht 60.0 in | Wt 134.6 lb

## 2023-11-15 DIAGNOSIS — I10 Essential (primary) hypertension: Secondary | ICD-10-CM | POA: Diagnosis not present

## 2023-11-15 DIAGNOSIS — K219 Gastro-esophageal reflux disease without esophagitis: Secondary | ICD-10-CM | POA: Diagnosis not present

## 2023-11-15 DIAGNOSIS — E7800 Pure hypercholesterolemia, unspecified: Secondary | ICD-10-CM | POA: Diagnosis not present

## 2023-11-15 DIAGNOSIS — R748 Abnormal levels of other serum enzymes: Secondary | ICD-10-CM

## 2023-11-15 DIAGNOSIS — G40109 Localization-related (focal) (partial) symptomatic epilepsy and epileptic syndromes with simple partial seizures, not intractable, without status epilepticus: Secondary | ICD-10-CM | POA: Diagnosis not present

## 2023-11-15 DIAGNOSIS — M170 Bilateral primary osteoarthritis of knee: Secondary | ICD-10-CM

## 2023-11-15 NOTE — Progress Notes (Signed)
 BP 136/70 (BP Location: Left Arm, Patient Position: Sitting, Cuff Size: Normal)   Pulse (!) 57   Temp (!) 97.5 F (36.4 C) (Oral)   Ht 5' (1.524 m)   Wt 134 lb 9.6 oz (61.1 kg)   LMP  (LMP Unknown)   SpO2 98%   BMI 26.29 kg/m    Subjective:    Patient ID: Renee Lopez, female    DOB: 1940/11/17, 83 y.o.   MRN: [id number]  HPI: Renee Lopez is a 83 y.o. female  Chief Complaint  Patient presents with   Arthritis   Hyperlipidemia   Hypertension   Seizures   HYPERTENSION / HYPERLIPIDEMIA Takes ASA, Lipitor, and Telmisartan. 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  Very active at baseline  History of gall bladder removal in 2007, labs have noted elevation in alk phos and GGT on occasion.  No symptoms. Satisfied with current treatment? yes Duration of hypertension: chronic BP monitoring frequency: three times a week BP range: 125/79 to 140/72 BP medication side effects: no Duration of hyperlipidemia: chronic Cholesterol medication side effects: no Cholesterol supplements: none Medication compliance: good compliance Aspirin: yes Recent stressors: no Recurrent headaches: no Visual changes: no Palpitations: no Dyspnea: no Chest pain: no Lower extremity edema: no Dizzy/lightheaded: no    SEIZURE DISORDER: Takes Keppra 500 MG BID.  Denies abdominal pain, N&V, SOB, or CP.  Has not had a seizure in many years, not since [place].  Has history of 3 seizures.  GERD Takes Pepcid every day with benefit. GERD control status: stable  Satisfied with current treatment? yes Heartburn frequency:  rare episodes Medication side effects: no  Medication compliance: stable Previous GERD medications: none Antacid use frequency: rarely uses TUMS Dysphagia: no Odynophagia:  no Hematemesis: no Blood in stool: no EGD: no  Relevant past medical, surgical, family and social history reviewed and updated as indicated. Interim medical  history since our last visit reviewed. Allergies and medications reviewed and updated.  Review of Systems  Constitutional:  Negative for activity change, appetite change, diaphoresis, fatigue and fever.  Respiratory:  Negative for cough, chest tightness and shortness of breath.   Cardiovascular:  Negative for chest pain, palpitations and leg swelling.  Gastrointestinal: Negative.   Neurological:  Negative for dizziness, seizures, syncope, weakness, light-headedness, numbness and headaches.  Psychiatric/Behavioral: Negative.      Per HPI unless specifically indicated above     Objective:    BP 136/70 (BP Location: Left Arm, Patient Position: Sitting, Cuff Size: Normal)   Pulse (!) 57   Temp (!) 97.5 F (36.4 C) (Oral)   Ht 5' (1.524 m)   Wt 134 lb 9.6 oz (61.1 kg)   LMP  (LMP Unknown)   SpO2 98%   BMI 26.29 kg/m   Wt Readings from Last 3 Encounters:  11/15/23 134 lb 9.6 oz (61.1 kg)  06/21/23 135 lb (61.2 kg)  05/17/23 133 lb (60.3 kg)    Physical Exam Vitals and nursing note reviewed.  Constitutional:      General: She is awake. She is not in acute distress.    Appearance: She is well-developed and well-groomed. She is not ill-appearing.  HENT:     Head: Normocephalic.     Right Ear: Hearing normal.     Left Ear: Hearing normal.  Eyes:     General: Lids are normal.        Right eye: No discharge.  Left eye: No discharge.     Conjunctiva/sclera: Conjunctivae normal.     Pupils: Pupils are equal, round, and reactive to light.  Neck:     Thyroid: No thyromegaly.     Vascular: No carotid bruit.  Cardiovascular:     Rate and Rhythm: Normal rate and regular rhythm.     Heart sounds: Normal heart sounds. No murmur heard.    No gallop.  Pulmonary:     Effort: Pulmonary effort is normal. No accessory muscle usage or respiratory distress.     Breath sounds: Normal breath sounds.  Abdominal:     General: Bowel sounds are normal.     Palpations: Abdomen is soft.   Musculoskeletal:     Cervical back: Normal range of motion and neck supple.     Right lower leg: No edema.     Left lower leg: No edema.  Skin:    General: Skin is warm and dry.  Neurological:     Mental Status: She is alert and oriented to person, place, and time.  Psychiatric:        Attention and Perception: Attention normal.        Mood and Affect: Mood normal.        Speech: Speech normal.        Behavior: Behavior normal. Behavior is cooperative.        Thought Content: Thought content normal.    Results for orders placed or performed in visit on 05/17/23  Comprehensive metabolic panel   Collection Time: 05/17/23 10:17 AM  Result Value Ref Range   Glucose 88 70 - 99 mg/dL   BUN 16 8 - 27 mg/dL   Creatinine, Ser 1.61 0.57 - 1.00 mg/dL   eGFR 58 (L) >09 UE/AVW/0.98   BUN/Creatinine Ratio 16 12 - 28   Sodium 144 134 - 144 mmol/L   Potassium 4.6 3.5 - 5.2 mmol/L   Chloride 106 96 - 106 mmol/L   CO2 23 20 - 29 mmol/L   Calcium 9.4 8.7 - 10.3 mg/dL   Total Protein 6.5 6.0 - 8.5 g/dL   Albumin 4.4 3.7 - 4.7 g/dL   Globulin, Total 2.1 1.5 - 4.5 g/dL   Bilirubin Total 0.7 0.0 - 1.2 mg/dL   Alkaline Phosphatase 106 44 - 121 IU/L   AST 20 0 - 40 IU/L   ALT 10 0 - 32 IU/L  Lipid Panel w/o Chol/HDL Ratio   Collection Time: 05/17/23 10:17 AM  Result Value Ref Range   Cholesterol, Total 132 100 - 199 mg/dL   Triglycerides 66 0 - 149 mg/dL   HDL 40 >11 mg/dL   VLDL Cholesterol Cal 14 5 - 40 mg/dL   LDL Chol Calc (NIH) 78 0 - 99 mg/dL      Assessment & Plan:   Problem List Items Addressed This Visit       Cardiovascular and Mediastinum   Essential hypertension   Chronic, stable.  BP at goal in office on recheck and at home for her age, she checks regularly.  Some white coat present as does not like provider settings.  Continue current medication regimen and adjust as needed.  LABS: CBC, CMP.  Recommend continued focus on DASH diet at home and regular BP checks.  Refills  up to date. Return in 6 months.      Relevant Orders   CBC with Differential/Platelet   Comprehensive metabolic panel     Digestive   GERD (gastroesophageal reflux disease)  Chronic, stable with OTC Pepcid.  Mag level today.  Continue current regimen and adjust as needed.        Relevant Orders   Magnesium     Nervous and Auditory   Focal epilepsy (HCC) - Primary   Chronic, stable on Keppra.  No recent seizure activity, none in several years since diagnosis.  CMP today.  Refills up to date.  Check level annually.      Relevant Orders   Levetiracetam level     Other   Elevated alkaline phosphatase level   History of gall bladder removal, overall no symptoms.  Monitor only at this time.      Relevant Orders   Comprehensive metabolic panel   Gamma GT   Hyperlipidemia   Chronic, ongoing.  Continue current medication regimen and adjust as needed.  Lipid panel today.  Refills up to date.      Relevant Orders   Comprehensive metabolic panel   Lipid Panel w/o Chol/HDL Ratio     Follow up plan: Return in about 6 months (around 05/17/2024) for HTN/HLD, SEIZURES.

## 2023-11-15 NOTE — Assessment & Plan Note (Signed)
History of gall bladder removal, overall no symptoms.  Monitor only at this time. 

## 2023-11-15 NOTE — Assessment & Plan Note (Signed)
 Chronic, stable.  BP at goal in office on recheck and at home for her age, she checks regularly.  Some white coat present as does not like provider settings.  Continue current medication regimen and adjust as needed.  LABS: CBC, CMP.  Recommend continued focus on DASH diet at home and regular BP checks.  Refills up to date. Return in 6 months.

## 2023-11-15 NOTE — Assessment & Plan Note (Signed)
 Chronic, ongoing.  Continue current medication regimen and adjust as needed.  Lipid panel today.  Refills up to date.

## 2023-11-15 NOTE — Assessment & Plan Note (Signed)
 Chronic, stable with OTC Pepcid.  Mag level today.  Continue current regimen and adjust as needed.

## 2023-11-15 NOTE — Assessment & Plan Note (Addendum)
 Chronic, stable on Keppra.  No recent seizure activity, none in several years since diagnosis.  CMP today.  Refills up to date.  Check level annually.

## 2023-11-16 LAB — CBC WITH DIFFERENTIAL/PLATELET
Basophils Absolute: 0 10*3/uL (ref 0.0–0.2)
Basos: 1 %
EOS (ABSOLUTE): 0.1 10*3/uL (ref 0.0–0.4)
Eos: 3 %
Hematocrit: 42.7 % (ref 34.0–46.6)
Hemoglobin: 13.8 g/dL (ref 11.1–15.9)
Immature Grans (Abs): 0 10*3/uL (ref 0.0–0.1)
Immature Granulocytes: 0 %
Lymphocytes Absolute: 1.1 10*3/uL (ref 0.7–3.1)
Lymphs: 26 %
MCH: 30.5 pg (ref 26.6–33.0)
MCHC: 32.3 g/dL (ref 31.5–35.7)
MCV: 94 fL (ref 79–97)
Monocytes Absolute: 0.4 10*3/uL (ref 0.1–0.9)
Monocytes: 9 %
Neutrophils Absolute: 2.5 10*3/uL (ref 1.4–7.0)
Neutrophils: 61 %
Platelets: 226 10*3/uL (ref 150–450)
RBC: 4.53 x10E6/uL (ref 3.77–5.28)
RDW: 12.8 % (ref 11.7–15.4)
WBC: 4.1 10*3/uL (ref 3.4–10.8)

## 2023-11-16 LAB — COMPREHENSIVE METABOLIC PANEL
ALT: 14 IU/L (ref 0–32)
AST: 26 IU/L (ref 0–40)
Albumin: 4.5 g/dL (ref 3.7–4.7)
Alkaline Phosphatase: 111 IU/L (ref 44–121)
BUN/Creatinine Ratio: 20 (ref 12–28)
BUN: 17 mg/dL (ref 8–27)
Bilirubin Total: 0.8 mg/dL (ref 0.0–1.2)
CO2: 23 mmol/L (ref 20–29)
Calcium: 9.6 mg/dL (ref 8.7–10.3)
Chloride: 105 mmol/L (ref 96–106)
Creatinine, Ser: 0.86 mg/dL (ref 0.57–1.00)
Globulin, Total: 2.2 g/dL (ref 1.5–4.5)
Glucose: 82 mg/dL (ref 70–99)
Potassium: 4.1 mmol/L (ref 3.5–5.2)
Sodium: 143 mmol/L (ref 134–144)
Total Protein: 6.7 g/dL (ref 6.0–8.5)
eGFR: 67 mL/min/{1.73_m2} (ref >59)

## 2023-11-16 LAB — LIPID PANEL W/O CHOL/HDL RATIO
Cholesterol, Total: 147 mg/dL (ref 100–199)
HDL: 43 mg/dL (ref >39)
LDL Chol Calc (NIH): 89 mg/dL (ref 0–99)
Triglycerides: 77 mg/dL (ref 0–149)
VLDL Cholesterol Cal: 15 mg/dL (ref 5–40)

## 2023-11-16 LAB — MAGNESIUM: Magnesium: 2.2 mg/dL (ref 1.6–2.3)

## 2023-11-16 LAB — GAMMA GT: GGT: 30 IU/L (ref 0–60)

## 2023-11-16 LAB — LEVETIRACETAM LEVEL: Levetiracetam Lvl: 17.1 ug/mL (ref 10.0–40.0)

## 2023-11-16 NOTE — Progress Notes (Signed)
 Please let Laray know that all labs have returned normal, just waiting on Keppra level to return.  If abnormal will let her know.  For now continue all medications.  Any questions? Keep being amazing!!  Thank you for allowing me to participate in your care.  I appreciate you. Kindest regards, Farhad Burleson

## 2024-05-20 NOTE — Patient Instructions (Signed)
 Be Involved in Caring For Your Health:  Taking Medications When medications are taken as directed, they can greatly improve your health. But if they are not taken as prescribed, they may not work. In some cases, not taking them correctly can be harmful. To help ensure your treatment remains effective and safe, understand your medications and how to take them. Bring your medications to each visit for review by your provider.  Your lab results, notes, and after visit summary will be available on My Chart. We strongly encourage you to use this feature. If lab results are abnormal the clinic will contact you with the appropriate steps. If the clinic does not contact you assume the results are satisfactory. You can always view your results on My Chart. If you have questions regarding your health or results, please contact the clinic during office hours. You can also ask questions on My Chart.  We at [facility] are grateful that you chose us  to provide your care. We strive to provide evidence-based and compassionate care and are always looking for feedback. If you get a survey from the clinic please complete this so we can hear your opinions.  Seizure, Adult A seizure is a sudden burst of abnormal activity in the brain. Seizures usually last from 30 seconds to 2 minutes. There are many types of seizures. And they can cause many different symptoms. What are the causes? Common causes of a seizure include: Fever or infection. Problems that affect the brain. These may include: A brain or head injury. A stroke. A brain tumor. Low levels of blood sugar or salt. Kidney problems or liver problems. Some inherited conditions. These are passed down from parent to child. Problems with a substance, such as: Having a reaction to a drug or a medicine. Stopping the use of a substance all of a sudden. When this causes problems, it's called withdrawal. Disorders that affect how you develop, such as  autism spectrum disorder or cerebral palsy. Sometimes, the cause may not be known. Some people who have a seizure never have another one. A person who has repeated seizures over time without a clear cause has a condition called epilepsy. What increases the risk? Having a family history of epilepsy. Having had a tonic-clonic seizure before. This type of seizure causes: The muscles of the whole body to tighten, or contract. Loss of consciousness. Having a head injury or a stroke in the past. Having had too little oxygen at birth. What are the signs or symptoms? The symptoms vary depending on the type of seizure you have. Symptoms during a seizure Having convulsions. This means shaking with fast, jerky movements of muscles. Stiffness of the body. Breathing problems. Being confused. Staring or not responding to sound or touch. Head nodding, eye blinking, eye twitching, or fast eye movements. Drooling, grunting, or making clicking sounds with your mouth. Losing control of when you pee or poop. Symptoms before a seizure Feeling afraid, worried, or nervous. Feeling like you may vomit. Vertigo. This feels like: You are moving when you're not. Things around you are moving when they're not. Dj vu. This is a feeling of having seen or heard something before. Odd tastes or smells. Changes in how you see. You may see flashing lights or spots. Symptoms after a seizure Being confused. Feeling sleepy. Headache. Sore muscles. How is this diagnosed? A seizure may be diagnosed based on: A description of your symptoms. Video of your seizures can be helpful. Your medical history. A physical exam.  Tests, such as: Blood tests. CT scan. MRI. Electroencephalogram, or EEG. This test measures electrical activity in the brain. A test of your spinal fluid. This is called a spinal tap or lumbar puncture. How is this treated? If your seizure stops on its own, you will not need treatment. If your  seizure lasts longer than 5 minutes, you'll normally need treatment. This may include: Medicines given through an IV. Avoiding things, such as medicines, that are known to cause your seizures. Medicines to prevent seizures. These are called antiepileptics. A device to prevent or control seizures. Eating foods that are low in carbohydrates and high in fat (ketogenic diet). Surgery. This is sometimes needed if you keep having seizures. Follow these instructions at home: Medicines Take your medicines only as told by your health care provider. Avoid anything that may keep your medicine from working, such as alcohol. Activity Follow your provider's advice about driving, swimming, and doing other things that would be dangerous if you had a seizure. Wait until your provider says it's safe for you to do these things. If you live in the [place] ask your local department of motor vehicles [facility]) when you can drive. Get enough rest and sleep. Not getting enough sleep can make seizures more likely to happen. Teaching others  Teach friends and family what to do if you have a seizure. Tell them to: Help you get down to the ground safely. Protect your head and body. Loosen any clothing around your neck. Turn you on your side. This helps keep your airway clear if you vomit. Know whether or not you need emergency care. Stay with you until you are better. Also, tell them what not to do if you have a seizure. Tell them: They should not hold you down. They should not put anything in your mouth. General instructions Avoid anything that has caused you to have seizures. Keep a seizure diary. Write down: What you remember about each seizure. What you think might have caused each seizure. Keep all follow-up visits. Your provider may need to monitor your progress. Contact a health care provider if: You have another seizure or seizures. Call each time you have a seizure. You have a change in how often or when  you have seizures. You keep having seizures with treatment. You have symptoms of being sick or having an infection. You are not able to take your medicine. Get help right away if: You have or someone has seen you have: A seizure that lasts longer than 5 minutes. Many seizures in a row and you don't feel better between seizures. A seizure that makes it harder to breathe. A seizure that leaves you unable to speak or use a part of your body. You didn't wake up right away after a seizure. You injure yourself during a seizure. You have confusion or pain right after a seizure. These symptoms may be an emergency. Call 911 right away. Do not wait to see if the symptoms will go away. Do not drive yourself to the hospital. This information is not intended to replace advice given to you by your health care provider. Make sure you discuss any questions you have with your health care provider. Document Revised: 05/19/2023 Document Reviewed: 09/29/2022 Elsevier Patient Education  2024 [employer].

## 2024-05-22 ENCOUNTER — Telehealth: Payer: Self-pay

## 2024-05-22 ENCOUNTER — Encounter: Payer: Self-pay | Admitting: Nurse Practitioner

## 2024-05-22 ENCOUNTER — Telehealth: Payer: Self-pay | Admitting: Nurse Practitioner

## 2024-05-22 ENCOUNTER — Ambulatory Visit (INDEPENDENT_AMBULATORY_CARE_PROVIDER_SITE_OTHER): Admitting: Nurse Practitioner

## 2024-05-22 VITALS — BP 118/80 | HR 60 | Temp 97.8°F | Resp 15 | Ht 60.0 in | Wt 131.2 lb

## 2024-05-22 DIAGNOSIS — E7800 Pure hypercholesterolemia, unspecified: Secondary | ICD-10-CM

## 2024-05-22 DIAGNOSIS — I10 Essential (primary) hypertension: Secondary | ICD-10-CM | POA: Diagnosis not present

## 2024-05-22 DIAGNOSIS — K219 Gastro-esophageal reflux disease without esophagitis: Secondary | ICD-10-CM

## 2024-05-22 DIAGNOSIS — G40109 Localization-related (focal) (partial) symptomatic epilepsy and epileptic syndromes with simple partial seizures, not intractable, without status epilepticus: Secondary | ICD-10-CM | POA: Diagnosis not present

## 2024-05-22 DIAGNOSIS — Z23 Encounter for immunization: Secondary | ICD-10-CM | POA: Diagnosis not present

## 2024-05-22 MED ORDER — FAMOTIDINE 40 MG PO TABS: ORAL_TABLET | ORAL | 4 refills | Status: AC

## 2024-05-22 MED ORDER — LEVETIRACETAM 500 MG PO TABS: 500.0000 mg | ORAL_TABLET | Freq: Two times a day (BID) | ORAL | 4 refills | Status: AC

## 2024-05-22 MED ORDER — ATORVASTATIN CALCIUM 10 MG PO TABS: 10.0000 mg | ORAL_TABLET | Freq: Every day | ORAL | 4 refills | Status: AC

## 2024-05-22 MED ORDER — TELMISARTAN 80 MG PO TABS: 80.0000 mg | ORAL_TABLET | Freq: Every day | ORAL | 4 refills | Status: AC

## 2024-05-22 NOTE — Progress Notes (Signed)
 BP 118/80 (BP Location: Left Arm, Patient Position: Sitting)   Pulse 60   Temp 97.8 F (36.6 C) (Oral)   Resp 15   Ht 5' (1.524 m)   Wt 131 lb 3.2 oz (59.5 kg)   LMP  (LMP Unknown)   SpO2 98%   BMI 25.62 kg/m    Subjective:    Patient ID: Renee Lopez, female    DOB: 1941-03-02, 83 y.o.   MRN: [id number]  HPI: Renee Lopez is a 83 y.o. female  Chief Complaint  Patient presents with   HTN/HLD    Home checks ranging 127/72.    Seizures    None at all lately.    HYPERTENSION / HYPERLIPIDEMIA & SEIZURE DISORDER Continues ASA, Lipitor, Telmisartan . 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  Continues Keppra  500 MG BID. Has not had a seizure in many years, not since [place].  Has history of 3 seizures total.  Satisfied with current treatment? yes Duration of hypertension: chronic BP monitoring frequency: three times a week BP range: 125/68 to 129/75 and HR in 60's at home BP medication side effects: no Duration of hyperlipidemia: chronic Cholesterol medication side effects: no Cholesterol supplements: none Medication compliance: good compliance Aspirin: yes Recent stressors: no Recurrent headaches: no Visual changes: no Palpitations: no Dyspnea: no Chest pain: no Lower extremity edema: no Dizzy/lightheaded: no    GERD Continues Pepcid  every day with benefit. GERD control status: stable  Satisfied with current treatment? yes Heartburn frequency:  rare episodes Medication side effects: no  Medication compliance: stable Previous GERD medications: none Antacid use frequency: occasional TUMS Dysphagia: no Odynophagia:  no Hematemesis: no Blood in stool: no EGD: no  Relevant past medical, surgical, family and social history reviewed and updated as indicated. Interim medical history since our last visit reviewed. Allergies and medications reviewed and updated.  Review of Systems  Constitutional:  Negative for activity change,  appetite change, diaphoresis, fatigue and fever.  Respiratory:  Negative for cough, chest tightness and shortness of breath.   Cardiovascular:  Negative for chest pain, palpitations and leg swelling.  Gastrointestinal: Negative.   Neurological:  Negative for dizziness, seizures, syncope, weakness, light-headedness, numbness and headaches.  Psychiatric/Behavioral: Negative.      Per HPI unless specifically indicated above     Objective:    BP 118/80 (BP Location: Left Arm, Patient Position: Sitting)   Pulse 60   Temp 97.8 F (36.6 C) (Oral)   Resp 15   Ht 5' (1.524 m)   Wt 131 lb 3.2 oz (59.5 kg)   LMP  (LMP Unknown)   SpO2 98%   BMI 25.62 kg/m   Wt Readings from Last 3 Encounters:  05/22/24 131 lb 3.2 oz (59.5 kg)  11/15/23 134 lb 9.6 oz (61.1 kg)  06/21/23 135 lb (61.2 kg)    Physical Exam Vitals and nursing note reviewed.  Constitutional:      General: She is awake. She is not in acute distress.    Appearance: She is well-developed and well-groomed. She is not ill-appearing.  HENT:     Head: Normocephalic.     Right Ear: Hearing normal.     Left Ear: Hearing normal.  Eyes:     General: Lids are normal.        Right eye: No discharge.        Left eye: No discharge.     Conjunctiva/sclera: Conjunctivae normal.     Pupils: Pupils are equal, round,  and reactive to light.  Neck:     Thyroid : No thyromegaly.     Vascular: No carotid bruit.  Cardiovascular:     Rate and Rhythm: Normal rate and regular rhythm.     Heart sounds: Normal heart sounds. No murmur heard.    No gallop.  Pulmonary:     Effort: Pulmonary effort is normal. No accessory muscle usage or respiratory distress.     Breath sounds: Normal breath sounds.  Abdominal:     General: Bowel sounds are normal.     Palpations: Abdomen is soft.  Musculoskeletal:     Cervical back: Normal range of motion and neck supple.     Right lower leg: No edema.     Left lower leg: No edema.  Skin:    General: Skin  is warm and dry.  Neurological:     Mental Status: She is alert and oriented to person, place, and time.  Psychiatric:        Attention and Perception: Attention normal.        Mood and Affect: Mood normal.        Speech: Speech normal.        Behavior: Behavior normal. Behavior is cooperative.        Thought Content: Thought content normal.    Results for orders placed or performed in visit on 11/15/23  CBC with Differential/Platelet   Collection Time: 11/15/23  9:48 AM  Result Value Ref Range   WBC 4.1 3.4 - 10.8 x10E3/uL   RBC 4.53 3.77 - 5.28 x10E6/uL   Hemoglobin 13.8 11.1 - 15.9 g/dL   Hematocrit 57.2 65.9 - 46.6 %   MCV 94 79 - 97 fL   MCH 30.5 26.6 - 33.0 pg   MCHC 32.3 31.5 - 35.7 g/dL   RDW 87.1 88.2 - 84.5 %   Platelets 226 150 - 450 x10E3/uL   Neutrophils 61 Not Estab. %   Lymphs 26 Not Estab. %   Monocytes 9 Not Estab. %   Eos 3 Not Estab. %   Basos 1 Not Estab. %   Neutrophils Absolute 2.5 1.4 - 7.0 x10E3/uL   Lymphocytes Absolute 1.1 0.7 - 3.1 x10E3/uL   Monocytes Absolute 0.4 0.1 - 0.9 x10E3/uL   EOS (ABSOLUTE) 0.1 0.0 - 0.4 x10E3/uL   Basophils Absolute 0.0 0.0 - 0.2 x10E3/uL   Immature Granulocytes 0 Not Estab. %   Immature Grans (Abs) 0.0 0.0 - 0.1 x10E3/uL  Comprehensive metabolic panel   Collection Time: 11/15/23  9:48 AM  Result Value Ref Range   Glucose 82 70 - 99 mg/dL   BUN 17 8 - 27 mg/dL   Creatinine, Ser 9.13 0.57 - 1.00 mg/dL   eGFR 67 >40 fO/fpw/8.26   BUN/Creatinine Ratio 20 12 - 28   Sodium 143 134 - 144 mmol/L   Potassium 4.1 3.5 - 5.2 mmol/L   Chloride 105 96 - 106 mmol/L   CO2 23 20 - 29 mmol/L   Calcium  9.6 8.7 - 10.3 mg/dL   Total Protein 6.7 6.0 - 8.5 g/dL   Albumin 4.5 3.7 - 4.7 g/dL   Globulin, Total 2.2 1.5 - 4.5 g/dL   Bilirubin Total 0.8 0.0 - 1.2 mg/dL   Alkaline Phosphatase 111 44 - 121 IU/L   AST 26 0 - 40 IU/L   ALT 14 0 - 32 IU/L  Lipid Panel w/o Chol/HDL Ratio   Collection Time: 11/15/23  9:48 AM  Result Value  Ref Range  Cholesterol, Total 147 100 - 199 mg/dL   Triglycerides 77 0 - 149 mg/dL   HDL 43 >60 mg/dL   VLDL Cholesterol Cal 15 5 - 40 mg/dL   LDL Chol Calc (NIH) 89 0 - 99 mg/dL  Magnesium   Collection Time: 11/15/23  9:48 AM  Result Value Ref Range   Magnesium 2.2 1.6 - 2.3 mg/dL  Gamma GT   Collection Time: 11/15/23  9:48 AM  Result Value Ref Range   GGT 30 0 - 60 IU/L  Levetiracetam  level   Collection Time: 11/15/23  9:48 AM  Result Value Ref Range   Levetiracetam  Lvl 17.1 10.0 - 40.0 ug/mL      Assessment & Plan:   Problem List Items Addressed This Visit       Cardiovascular and Mediastinum   Essential hypertension   Chronic, stable.  BP at goal in office on recheck and at home for her age, she checks regularly.  Some white coat present as does not like provider settings.  Continue current medication regimen and adjust as needed.  LABS: obtain at next visit.  Recommend continued focus on DASH diet at home and regular BP checks.  Refills up to date. Return in 6 months.      Relevant Medications   atorvastatin  (LIPITOR) 10 MG tablet   telmisartan  (MICARDIS ) 80 MG tablet     Digestive   GERD (gastroesophageal reflux disease)   Chronic, stable with OTC Pepcid .  Mag level annually.  Continue current regimen and adjust as needed.        Relevant Medications   famotidine  (PEPCID ) 40 MG tablet     Nervous and Auditory   Focal epilepsy (HCC) - Primary   Chronic, stable on Keppra .  No recent seizure activity, none in several years since diagnosis.  Refills up to date.  Check Keppra  level annually.      Relevant Medications   levETIRAcetam  (KEPPRA ) 500 MG tablet     Other   Hyperlipidemia   Chronic, ongoing.  Continue current medication regimen and adjust as needed.  Lipid panel next visit.  Refills up to date.      Relevant Medications   atorvastatin  (LIPITOR) 10 MG tablet   telmisartan  (MICARDIS ) 80 MG tablet   Other Visit Diagnoses       Flu vaccine need        Flu vaccine today, educated patient.        Follow up plan: Return in about 6 months (around 11/19/2024) for HTN/HLD, SEIZURES, GERD -- annual labs.

## 2024-05-22 NOTE — Assessment & Plan Note (Signed)
 Chronic, stable.  BP at goal in office on recheck and at home for her age, she checks regularly.  Some white coat present as does not like provider settings.  Continue current medication regimen and adjust as needed.  LABS: obtain at next visit.  Recommend continued focus on DASH diet at home and regular BP checks.  Refills up to date. Return in 6 months.

## 2024-05-22 NOTE — Telephone Encounter (Signed)
 Patient's daughter dropped off Disability Parking Placard to be filled out by provider. Patient's daughter is requesting a call back at [phone] within 2-5 days when completed. Document is located in providers folder.  FYI: Patient's daughter brought previous placard form that was completed by provider on 05-22-24. Patient's husband signed form and stated DMV would not accept it. New form need to be completed.

## 2024-05-22 NOTE — Assessment & Plan Note (Signed)
 Chronic, stable with OTC Pepcid .  Mag level annually.  Continue current regimen and adjust as needed.

## 2024-05-22 NOTE — Telephone Encounter (Unsigned)
 Copied from CRM #[id number]. Topic: General - Other >> May 22, 2024 11:35 AM Treva T wrote: Reason for CRM: Received call from daughter, Nena, calling with questions regarding handicapped placard for patient to be taken to [facility].  Has questions regarding form prior to completion. Per daughter, form was already dropped off at office today.  Can be reached at [phone].  Patient daughter aware of same day call back.

## 2024-05-22 NOTE — Assessment & Plan Note (Signed)
 Chronic, ongoing.  Continue current medication regimen and adjust as needed.  Lipid panel next visit.  Refills up to date.

## 2024-05-22 NOTE — Assessment & Plan Note (Signed)
 Chronic, stable on Keppra .  No recent seizure activity, none in several years since diagnosis.  Refills up to date.  Check Keppra  level annually.

## 2024-05-24 ENCOUNTER — Telehealth: Payer: Self-pay | Admitting: Nurse Practitioner

## 2024-05-24 NOTE — Telephone Encounter (Unsigned)
 Copied from CRM [id number]. Topic: General - Other >> May 24, 2024 12:08 PM Kevelyn M wrote: Reason for CRM: Patient's daughter calling in for status of Handicap placard.  Call back #[phone]

## 2024-05-25 NOTE — Telephone Encounter (Signed)
 Daughter is aware that while we can redo the paperwork the [facility] said since her mom has no license they would not accept a form on her behalf.

## 2024-06-26 ENCOUNTER — Ambulatory Visit: Payer: Self-pay | Admitting: Emergency Medicine

## 2024-06-26 VITALS — Ht 63.0 in | Wt 132.0 lb

## 2024-06-26 DIAGNOSIS — Z0000 Encounter for general adult medical examination without abnormal findings: Secondary | ICD-10-CM

## 2024-06-26 NOTE — Progress Notes (Signed)
 Subjective:   Renee Lopez is a 83 y.o. who presents for a Medicare Wellness preventive visit.  As a reminder, Annual Wellness Visits don't include a physical exam, and some assessments may be limited, especially if this visit is performed virtually. We may recommend an in-person follow-up visit with your provider if needed.  Visit Complete: Virtual I connected with  Renee Lopez on 06/26/24 by a audio enabled telemedicine application and verified that I am speaking with the correct person using two identifiers.  Patient Location: Home  Provider Location: Office/Clinic  I discussed the limitations of evaluation and management by telemedicine. The patient expressed understanding and agreed to proceed.  Vital Signs: Because this visit was a virtual/telehealth visit, some criteria may be missing or patient reported. Any vitals not documented were not able to be obtained and vitals that have been documented are patient reported.  VideoDeclined- This patient declined Librarian, academic. Therefore the visit was completed with audio only.  Persons Participating in Visit: Patient.  AWV Questionnaire: No: Patient Medicare AWV questionnaire was not completed prior to this visit.  Cardiac Risk Factors include: advanced age (>3men, >69 women);dyslipidemia;hypertension     Objective:    Today's Vitals   06/26/24 0945  Weight: 132 lb (59.9 kg)  Height: 5' 3 (1.6 m)   Body mass index is 23.38 kg/m.     06/26/2024    9:52 AM 06/21/2023   10:48 AM 06/08/2022    9:34 AM 06/05/2021   10:30 AM 10/08/2020   10:59 AM 09/16/2020   10:08 AM 06/02/2020    9:49 AM  Advanced Directives  Does Patient Have a Medical Advance Directive? Yes Yes Yes Yes Yes Yes Yes  Type of Estate Agent of [place];Living will Healthcare Power of [place];Living will Healthcare Power of [place] of [place];Living will Healthcare Power  of [place];Living will Healthcare Power of [place];Living will Healthcare Power of [place];Living will  Does patient want to make changes to medical advance directive? No - Patient declined No - Patient declined   No - Patient declined No - Guardian declined   Copy of Healthcare Power of Attorney in Chart? Yes - validated most recent copy scanned in chart (See row information) Yes - validated most recent copy scanned in chart (See row information) Yes - validated most recent copy scanned in chart (See row information) Yes - validated most recent copy scanned in chart (See row information) Yes - validated most recent copy scanned in chart (See row information) Yes - validated most recent copy scanned in chart (See row information) Yes - validated most recent copy scanned in chart (See row information)    Current Medications (verified) Outpatient Encounter Medications as of 06/26/2024  Medication Sig   aspirin 81 MG tablet Take 81 mg by mouth daily.   atorvastatin  (LIPITOR) 10 MG tablet Take 1 tablet (10 mg total) by mouth daily at 6 PM.   famotidine  (PEPCID ) 40 MG tablet TAKE 1 TABLET BY MOUTH EVERY DAY IN THE EVENING   levETIRAcetam  (KEPPRA ) 500 MG tablet Take 1 tablet (500 mg total) by mouth 2 (two) times daily.   Multiple Vitamins-Minerals (MULTIVITAMIN WITH IRON-MINERALS) liquid Take by mouth daily.   telmisartan  (MICARDIS ) 80 MG tablet Take 1 tablet (80 mg total) by mouth daily.   No facility-administered encounter medications on file as of 06/26/2024.    Allergies (verified) Patient has no known allergies.   History: Past Medical History:  Diagnosis Date  Arthritis    Hyperlipidemia    Hypertension    Seizures (HCC)    Wears dentures    full upper and lower   Past Surgical History:  Procedure Laterality Date   CATARACT EXTRACTION W/PHACO Right 09/16/2020   Procedure: CATARACT EXTRACTION PHACO AND INTRAOCULAR LENS PLACEMENT (IOC) RIGHT 10.65 01:21.5 13.1%;  Surgeon:  Mittie Gaskin, MD;  Location: [facility];  Service: Ophthalmology;  Laterality: Right;   CATARACT EXTRACTION W/PHACO Left 10/08/2020   Procedure: CATARACT EXTRACTION PHACO AND INTRAOCULAR LENS PLACEMENT (IOC) LEFT;  Surgeon: Mittie Gaskin, MD;  Location: [facility];  Service: Ophthalmology;  Laterality: Left;  6.69 1:15.9 8.8%   CHOLECYSTECTOMY     Family History  Problem Relation Age of Onset   Seizures Mother    Hyperlipidemia Mother    Hypertension Mother    Cancer Father        colon   Hypertension Father    Social History   Socioeconomic History   Marital status: Married    Spouse name: Prentice   Number of children: 6   Years of education: Not on file   Highest education level: High school graduate  Occupational History   Occupation: retired  Tobacco Use   Smoking status: Never   Smokeless tobacco: Never  Vaping Use   Vaping status: Never Used  Substance and Sexual Activity   Alcohol use: No    Alcohol/week: 0.0 standard drinks of alcohol   Drug use: No   Sexual activity: Yes  Other Topics Concern   Not on file  Social History Narrative   6 children, 4 girls and 2 boys   Social Drivers of Corporate Investment Banker Strain: Low Risk  (06/26/2024)   Overall Financial Resource Strain (CARDIA)    Difficulty of Paying Living Expenses: Not hard at all  Food Insecurity: No Food Insecurity (06/26/2024)   Hunger Vital Sign    Worried About Running Out of Food in the Last Year: Never true    Ran Out of Food in the Last Year: Never true  Transportation Needs: No Transportation Needs (06/26/2024)   PRAPARE - Administrator, Civil Service (Medical): No    Lack of Transportation (Non-Medical): No  Physical Activity: Inactive (06/26/2024)   Exercise Vital Sign    Days of Exercise per Week: 0 days    Minutes of Exercise per Session: 0 min  Stress: No Stress Concern Present (06/26/2024)   [employer] of Occupational Health -  Occupational Stress Questionnaire    Feeling of Stress: Not at all  Social Connections: Moderately Integrated (06/26/2024)   Social Connection and Isolation Panel    Frequency of Communication with Friends and Family: More than three times a week    Frequency of Social Gatherings with Friends and Family: Three times a week    Attends Religious Services: More than 4 times per year    Active Member of Clubs or Organizations: No    Attends Banker Meetings: Never    Marital Status: Married    Tobacco Counseling Counseling given: Not Answered    Clinical Intake:  Pre-visit preparation completed: Yes  Pain : No/denies pain     BMI - recorded: 23.38 Nutritional Status: BMI of 19-24  Normal Nutritional Risks: None Diabetes: No  Lab Results  Component Value Date   HGBA1C 5.7 12/12/2011     How often do you need to have someone help you when you read instructions, pamphlets, or other written  materials from your doctor or pharmacy?: 1 - Never  Interpreter Needed?: No  Information entered by :: Vina Ned, CMA   Activities of Daily Living     06/26/2024    9:46 AM  In your present state of health, do you have any difficulty performing the following activities:  Hearing? 0  Vision? 0  Difficulty concentrating or making decisions? 0  Walking or climbing stairs? 0  Dressing or bathing? 0  Doing errands, shopping? 1  Comment doesn't drive, daughter takes to appointments  Preparing Food and eating ? N  Using the Toilet? N  In the past six months, have you accidently leaked urine? N  Do you have problems with loss of bowel control? N  Managing your Medications? N  Managing your Finances? N  Housekeeping or managing your Housekeeping? N    Patient Care Team: Cannady, Jolene T, NP as PCP - General (Nurse Practitioner) Mittie Gaskin, MD as Referring Physician (Ophthalmology)  I have updated your Care Teams any recent Medical Services you may have  received from other providers in the past year.     Assessment:   This is a routine wellness examination for Albert.  Hearing/Vision screen Hearing Screening - Comments:: Denies hearing loss   Vision Screening - Comments:: Gets routine eye exams, Dr. Mittie Jacobs [place]   Goals Addressed               This Visit's Progress     Increase physical activity (pt-stated)        Exercise more       Depression Screen     06/26/2024    9:50 AM 05/22/2024    8:38 AM 11/15/2023    9:17 AM 06/21/2023   10:46 AM 05/17/2023    9:40 AM 11/12/2022    9:38 AM 06/08/2022    9:39 AM  PHQ 2/9 Scores  PHQ - 2 Score 0 0 0 0 0 0 0  PHQ- 9 Score 0 0 0 0 0 0 0    Fall Risk     06/26/2024    9:53 AM 05/22/2024    8:38 AM 11/15/2023    9:17 AM 06/21/2023   10:49 AM 05/17/2023    9:40 AM  Fall Risk   Falls in the past year? 0 0 0 0 0  Number falls in past yr: 0 0 0 0 0  Injury with Fall? 0 0 0 0 0  Risk for fall due to : No Fall Risks No Fall Risks No Fall Risks No Fall Risks No Fall Risks  Follow up Falls evaluation completed Falls evaluation completed Falls evaluation completed Falls prevention discussed Falls evaluation completed    MEDICARE RISK AT HOME:  Medicare Risk at Home Any stairs in or around the home?: Yes If so, are there any without handrails?: No Home free of loose throw rugs in walkways, pet beds, electrical cords, etc?: Yes Adequate lighting in your home to reduce risk of falls?: Yes Life alert?: No Use of a cane, walker or w/c?: No Grab bars in the bathroom?: Yes Shower chair or bench in shower?: No Elevated toilet seat or a handicapped toilet?: Yes  TIMED UP AND GO:  Was the test performed?  No  Cognitive Function: 6CIT completed        06/26/2024    9:55 AM 06/21/2023   10:50 AM 06/08/2022    9:35 AM 06/05/2021   10:33 AM 06/02/2020    9:53 AM  6CIT Screen  What  Year? 0 points 0 points 0 points 0 points 0 points  What month? 0 points 0 points  0 points 0 points 0 points  What time? 0 points 0 points 0 points 0 points 0 points  Count back from 20 0 points 0 points 0 points 0 points 0 points  Months in reverse 0 points 0 points 0 points 0 points 0 points  Repeat phrase 0 points 0 points 0 points 0 points 0 points  Total Score 0 points 0 points 0 points 0 points 0 points    Immunizations Immunization History  Administered Date(s) Administered   Fluad Quad(high Dose 65+) 06/19/2022   INFLUENZA, HIGH DOSE SEASONAL PF 06/24/2017, 06/07/2018, 06/19/2019, 06/14/2020, 06/13/2021, 05/21/2023, 06/23/2024   Influenza-Unspecified 06/09/2015, 06/10/2016, 06/21/2017   PFIZER(Purple Top)SARS-COV-2 Vaccination 11/15/2019, 12/11/2019, 07/13/2020   Pfizer Covid-19 Vaccine Bivalent Booster 34yrs & up 07/18/2021   Pneumococcal Conjugate-13 05/10/2014   Pneumococcal Polysaccharide-23 05/19/2016   Td 01/07/2014    Screening Tests Health Maintenance  Topic Date Due   COVID-19 Vaccine (5 - 2025-26 season) 04/30/2024   Zoster Vaccines- Shingrix (1 of 2) 08/19/2024 (Originally 04/06/1991)   DTaP/Tdap/Td (2 - Tdap) 05/20/2025 (Originally 01/08/2024)   Medicare Annual Wellness (AWV)  06/26/2025   Pneumococcal Vaccine: 50+ Years  Completed   Influenza Vaccine  Completed   Meningococcal B Vaccine  Aged Out   DEXA SCAN  Discontinued    Health Maintenance Items Addressed: See Nurse Notes at the end of this note  Additional Screening:  Vision Screening: Recommended annual ophthalmology exams for early detection of glaucoma and other disorders of the eye. Is the patient up to date with their annual eye exam?  Yes  Who is the provider or what is the name of the office in which the patient attends annual eye exams? Dr. Mittie @ [facility] [place]  Dental Screening: Recommended annual dental exams for proper oral hygiene  Community Resource Referral / Chronic Care Management: CRR required this visit?  No   CCM required this visit?   No   Plan:    I have personally reviewed and noted the following in the patient's chart:   Medical and social history Use of alcohol, tobacco or illicit drugs  Current medications and supplements including opioid prescriptions. Patient is not currently taking opioid prescriptions. Functional ability and status Nutritional status Physical activity Advanced directives List of other physicians Hospitalizations, surgeries, and ER visits in previous 12 months Vitals Screenings to include cognitive, depression, and falls Referrals and appointments  In addition, I have reviewed and discussed with patient certain preventive protocols, quality metrics, and best practice recommendations. A written personalized care plan for preventive services as well as general preventive health recommendations were provided to patient.   Vina Ned, CMA   06/26/2024   After Visit Summary: (Mail) Due to this being a telephonic visit, the after visit summary with patients personalized plan was offered to patient via mail   Notes:  Declined Tdap, Covid and Shingles vaccines Screening colonoscopy, MMG and DEXA scan no longer recommended due to age

## 2024-06-26 NOTE — Patient Instructions (Signed)
 Renee Lopez,  Thank you for taking the time for your Medicare Wellness Visit. I appreciate your continued commitment to your health goals. Please review the care plan we discussed, and feel free to reach out if I can assist you further.  Medicare recommends these wellness visits once per year to help you and your care team stay ahead of potential health issues. These visits are designed to focus on prevention, allowing your provider to concentrate on managing your acute and chronic conditions during your regular appointments.  Please note that Annual Wellness Visits do not include a physical exam. Some assessments may be limited, especially if the visit was conducted virtually. If needed, we may recommend a separate in-person follow-up with your provider.  Ongoing Care Seeing your primary care provider every 3 to 6 months helps us  monitor your health and provide consistent, personalized care.   Referrals If a referral was made during today's visit and you haven't received any updates within two weeks, please contact the referred provider directly to check on the status.  Recommended Screenings:  Keep up the good work!  Health Maintenance  Topic Date Due   COVID-19 Vaccine (5 - 2025-26 season) 04/30/2024   Zoster (Shingles) Vaccine (1 of 2) 08/19/2024*   DTaP/Tdap/Td vaccine (2 - Tdap) 05/20/2025*   Medicare Annual Wellness Visit  06/26/2025   Pneumococcal Vaccine for age over 3  Completed   Flu Shot  Completed   Meningitis B Vaccine  Aged Out   DEXA scan (bone density measurement)  Discontinued  *Topic was postponed. The date shown is not the original due date.       06/26/2024    9:52 AM  Advanced Directives  Does Patient Have a Medical Advance Directive? Yes  Type of Estate Agent of [place];Living will  Does patient want to make changes to medical advance directive? No - Patient declined  Copy of Healthcare Power of Attorney in Chart? Yes - validated  most recent copy scanned in chart (See row information)   Advance Care Planning is important because it: Ensures you receive medical care that aligns with your values, goals, and preferences. Provides guidance to your family and loved ones, reducing the emotional burden of decision-making during critical moments.  Vision: Annual vision screenings are recommended for early detection of glaucoma, cataracts, and diabetic retinopathy. These exams can also reveal signs of chronic conditions such as diabetes and high blood pressure.  Dental: Annual dental screenings help detect early signs of oral cancer, gum disease, and other conditions linked to overall health, including heart disease and diabetes.  Please see the attached documents for additional preventive care recommendations.   Fall Prevention in the Home, Adult Falls can cause injuries and affect people of all ages. There are many simple things that you can do to make your home safe and to help prevent falls. If you need it, ask for help making these changes. What actions can I take to prevent falls? General information Use good lighting in all rooms. Make sure to: Replace any light bulbs that burn out. Turn on lights if it is dark and use night-lights. Keep items that you use often in easy-to-reach places. Lower the shelves around your home if needed. Move furniture so that there are clear paths around it. Do not keep throw rugs or other things on the floor that can make you trip. If any of your floors are uneven, fix them. Add color or contrast paint or tape to clearly mark and  help you see: Grab bars or handrails. First and last steps of staircases. Where the edge of each step is. If you use a ladder or stepladder: Make sure that it is fully opened. Do not climb a closed ladder. Make sure the sides of the ladder are locked in place. Have someone hold the ladder while you use it. Know where your pets are as you move through your  home. What can I do in the bathroom?     Keep the floor dry. Clean up any water that is on the floor right away. Remove soap buildup in the bathtub or shower. Buildup makes bathtubs and showers slippery. Use non-skid mats or decals on the floor of the bathtub or shower. Attach bath mats securely with double-sided, non-slip rug tape. If you need to sit down while you are in the shower, use a non-slip stool. Install grab bars by the toilet and in the bathtub and shower. Do not use towel bars as grab bars. What can I do in the bedroom? Make sure that you have a light by your bed that is easy to reach. Do not use any sheets or blankets on your bed that hang to the floor. Have a firm bench or chair with side arms that you can use for support when you get dressed. What can I do in the kitchen? Clean up any spills right away. If you need to reach something above you, use a sturdy step stool that has a grab bar. Keep electrical cables out of the way. Do not use floor polish or wax that makes floors slippery. What can I do with my stairs? Do not leave anything on the stairs. Make sure that you have a light switch at the top and the bottom of the stairs. Have them installed if you do not have them. Make sure that there are handrails on both sides of the stairs. Fix handrails that are broken or loose. Make sure that handrails are as long as the staircases. Install non-slip stair treads on all stairs in your home if they do not have carpet. Avoid having throw rugs at the top or bottom of stairs, or secure the rugs with carpet tape to prevent them from moving. Choose a carpet design that does not hide the edge of steps on the stairs. Make sure that carpet is firmly attached to the stairs. Fix any carpet that is loose or worn. What can I do on the outside of my home? Use bright outdoor lighting. Repair the edges of walkways and driveways and fix any cracks. Clear paths of anything that can make you  trip, such as tools or rocks. Add color or contrast paint or tape to clearly mark and help you see high doorway thresholds. Trim any bushes or trees on the main path into your home. Check that handrails are securely fastened and in good repair. Both sides of all steps should have handrails. Install guardrails along the edges of any raised decks or porches. Have leaves, snow, and ice cleared regularly. Use sand, salt, or ice melt on walkways during winter months if you live where there is ice and snow. In the garage, clean up any spills right away, including grease or oil spills. What other actions can I take? Review your medicines with your health care provider. Some medicines can make you confused or feel dizzy. This can increase your chance of falling. Wear closed-toe shoes that fit well and support your feet. Wear shoes that have rubber  soles and low heels. Use a cane, walker, scooter, or crutches that help you move around if needed. Talk with your provider about other ways that you can decrease your risk of falls. This may include seeing a physical therapist to learn to do exercises to improve movement and strength. Where to find more information Centers for Disease Control and Prevention, [employer]: tonerpromos.no [employer] on Aging: baseringtones.pl National Institute on Aging: baseringtones.pl Contact a health care provider if: You are afraid of falling at home. You feel weak, drowsy, or dizzy at home. You fall at home. Get help right away if you: Lose consciousness or have trouble moving after a fall. Have a fall that causes a head injury. These symptoms may be an emergency. Get help right away. Call 911. Do not wait to see if the symptoms will go away. Do not drive yourself to the hospital. This information is not intended to replace advice given to you by your health care provider. Make sure you discuss any questions you have with your health care provider. Document Revised: 04/19/2022  Document Reviewed: 04/19/2022 Elsevier Patient Education  2024 [employer].

## 2024-11-20 ENCOUNTER — Ambulatory Visit: Admitting: Nurse Practitioner

## 2025-07-02 ENCOUNTER — Ambulatory Visit
